# Patient Record
Sex: Male | Born: 1975
Health system: Southern US, Community
[De-identification: ages and names within clinical notes are randomized; demographics above are authoritative.]

## PROBLEM LIST (undated history)

## (undated) ENCOUNTER — Emergency Department (HOSPITAL_COMMUNITY): Discharge status: Against Medical Advice | Payer: Commercial Managed Care - PPO

## (undated) DIAGNOSIS — E2681 Bartter's syndrome: Secondary | ICD-10-CM

## (undated) DIAGNOSIS — K589 Irritable bowel syndrome without diarrhea: Secondary | ICD-10-CM

## (undated) DIAGNOSIS — K8020 Calculus of gallbladder without cholecystitis without obstruction: Secondary | ICD-10-CM

## (undated) DIAGNOSIS — F419 Anxiety disorder, unspecified: Secondary | ICD-10-CM

## (undated) DIAGNOSIS — E876 Hypokalemia: Secondary | ICD-10-CM

## (undated) DIAGNOSIS — E785 Hyperlipidemia, unspecified: Secondary | ICD-10-CM

## (undated) DIAGNOSIS — I10 Essential (primary) hypertension: Secondary | ICD-10-CM

## (undated) HISTORY — DX: Anxiety disorder, unspecified: F41.9

## (undated) HISTORY — DX: Irritable bowel syndrome without diarrhea: K58.9

## (undated) HISTORY — DX: Hypokalemia: E87.6

## (undated) HISTORY — DX: Bartter's syndrome: E26.81

## (undated) HISTORY — PX: NO PAST SURGERIES: SHX2092

## (undated) HISTORY — DX: Hyperlipidemia, unspecified: E78.5

## (undated) HISTORY — DX: Essential (primary) hypertension: I10

## (undated) HISTORY — DX: Calculus of gallbladder without cholecystitis without obstruction: K80.20

---

## 1999-06-28 ENCOUNTER — Emergency Department (HOSPITAL_COMMUNITY): Admission: EM | Admit: 1999-06-28 | Discharge: 1999-06-28 | Payer: Self-pay | Admitting: Emergency Medicine

## 2005-05-11 ENCOUNTER — Ambulatory Visit: Payer: Self-pay | Admitting: Pulmonary Disease

## 2005-06-15 ENCOUNTER — Ambulatory Visit: Payer: Self-pay | Admitting: Pulmonary Disease

## 2005-07-01 ENCOUNTER — Ambulatory Visit: Payer: Self-pay | Admitting: Pulmonary Disease

## 2005-07-20 ENCOUNTER — Ambulatory Visit: Payer: Self-pay | Admitting: Pulmonary Disease

## 2005-08-11 ENCOUNTER — Ambulatory Visit: Payer: Self-pay | Admitting: Pulmonary Disease

## 2007-01-12 ENCOUNTER — Emergency Department (HOSPITAL_COMMUNITY): Admission: EM | Admit: 2007-01-12 | Discharge: 2007-01-13 | Payer: Self-pay | Admitting: Emergency Medicine

## 2007-03-03 ENCOUNTER — Ambulatory Visit: Payer: Self-pay | Admitting: Pulmonary Disease

## 2007-03-03 LAB — CONVERTED CEMR LAB
Basophils Relative: 0.5 % (ref 0.0–1.0)
Bilirubin, Direct: 0.1 mg/dL (ref 0.0–0.3)
CO2: 32 meq/L (ref 19–32)
Cholesterol: 220 mg/dL (ref 0–200)
Direct LDL: 132.4 mg/dL
Eosinophils Relative: 1 % (ref 0.0–5.0)
GFR calc Af Amer: 113 mL/min
Glucose, Bld: 114 mg/dL — ABNORMAL HIGH (ref 70–99)
Hemoglobin: 14.1 g/dL (ref 13.0–17.0)
Lymphocytes Relative: 28.5 % (ref 12.0–46.0)
Monocytes Absolute: 0.8 10*3/uL — ABNORMAL HIGH (ref 0.2–0.7)
Monocytes Relative: 10.3 % (ref 3.0–11.0)
Neutro Abs: 4.7 10*3/uL (ref 1.4–7.7)
Potassium: 2.9 meq/L — ABNORMAL LOW (ref 3.5–5.1)
TSH: 1.99 microintl units/mL (ref 0.35–5.50)
Total Protein: 8 g/dL (ref 6.0–8.3)
VLDL: 20 mg/dL (ref 0–40)
WBC: 7.9 10*3/uL (ref 4.5–10.5)

## 2008-04-23 ENCOUNTER — Ambulatory Visit: Payer: Self-pay | Admitting: Pulmonary Disease

## 2008-04-23 DIAGNOSIS — E876 Hypokalemia: Secondary | ICD-10-CM | POA: Insufficient documentation

## 2008-04-23 DIAGNOSIS — K589 Irritable bowel syndrome without diarrhea: Secondary | ICD-10-CM | POA: Insufficient documentation

## 2008-04-23 DIAGNOSIS — E7800 Pure hypercholesterolemia, unspecified: Secondary | ICD-10-CM | POA: Insufficient documentation

## 2008-04-24 LAB — CONVERTED CEMR LAB
ALT: 38 units/L (ref 0–53)
AST: 26 units/L (ref 0–37)
Albumin: 4.4 g/dL (ref 3.5–5.2)
BUN: 16 mg/dL (ref 6–23)
Basophils Relative: 1.1 % (ref 0.0–3.0)
Chloride: 101 meq/L (ref 96–112)
Creatinine, Ser: 1.1 mg/dL (ref 0.4–1.5)
Direct LDL: 153 mg/dL
Eosinophils Absolute: 0.1 10*3/uL (ref 0.0–0.7)
Eosinophils Relative: 1.6 % (ref 0.0–5.0)
GFR calc non Af Amer: 83 mL/min
Glucose, Bld: 114 mg/dL — ABNORMAL HIGH (ref 70–99)
HCT: 43.9 % (ref 39.0–52.0)
HDL: 43.4 mg/dL (ref >39.0)
MCV: 79.8 fL (ref 78.0–100.0)
Monocytes Relative: 10.9 % (ref 3.0–12.0)
Neutrophils Relative %: 57.8 % (ref 43.0–77.0)
RBC: 5.49 M/uL (ref 4.22–5.81)
Total Protein: 7.4 g/dL (ref 6.0–8.3)
WBC: 7.9 10*3/uL (ref 4.5–10.5)

## 2008-05-16 ENCOUNTER — Encounter: Payer: Self-pay | Admitting: Pulmonary Disease

## 2008-05-24 ENCOUNTER — Encounter: Payer: Self-pay | Admitting: Pulmonary Disease

## 2008-06-07 ENCOUNTER — Encounter: Payer: Self-pay | Admitting: Pulmonary Disease

## 2008-06-26 ENCOUNTER — Encounter: Payer: Self-pay | Admitting: Pulmonary Disease

## 2008-08-17 ENCOUNTER — Encounter: Payer: Self-pay | Admitting: Pulmonary Disease

## 2008-09-13 ENCOUNTER — Encounter: Payer: Self-pay | Admitting: Pulmonary Disease

## 2008-09-25 ENCOUNTER — Encounter: Payer: Self-pay | Admitting: Pulmonary Disease

## 2009-01-07 ENCOUNTER — Ambulatory Visit: Payer: Self-pay | Admitting: Pulmonary Disease

## 2009-01-08 LAB — CONVERTED CEMR LAB
ALT: 32 units/L (ref 0–53)
Albumin: 5 g/dL (ref 3.5–5.2)
Alkaline Phosphatase: 62 units/L (ref 39–117)
Basophils Relative: 1.3 % (ref 0.0–3.0)
CO2: 34 meq/L — ABNORMAL HIGH (ref 19–32)
Chloride: 99 meq/L (ref 96–112)
Cholesterol: 240 mg/dL — ABNORMAL HIGH (ref 0–200)
Direct LDL: 162.1 mg/dL
Eosinophils Absolute: 0 10*3/uL (ref 0.0–0.7)
Eosinophils Relative: 0.5 % (ref 0.0–5.0)
Hemoglobin: 15.8 g/dL (ref 13.0–17.0)
MCHC: 33.9 g/dL (ref 30.0–36.0)
MCV: 79.6 fL (ref 78.0–100.0)
Magnesium: 1.8 mg/dL (ref 1.5–2.5)
Monocytes Absolute: 0.9 10*3/uL (ref 0.1–1.0)
Neutro Abs: 7 10*3/uL (ref 1.4–7.7)
Potassium: 3.9 meq/L (ref 3.5–5.1)
RBC: 5.84 M/uL — ABNORMAL HIGH (ref 4.22–5.81)
Sodium: 144 meq/L (ref 135–145)
Total CHOL/HDL Ratio: 3
Total Protein: 8.4 g/dL — ABNORMAL HIGH (ref 6.0–8.3)
VLDL: 14.4 mg/dL (ref 0.0–40.0)
WBC: 9.9 10*3/uL (ref 4.5–10.5)

## 2009-01-09 ENCOUNTER — Encounter: Payer: Self-pay | Admitting: Pulmonary Disease

## 2009-03-28 ENCOUNTER — Encounter: Payer: Self-pay | Admitting: Pulmonary Disease

## 2009-05-07 ENCOUNTER — Telehealth (INDEPENDENT_AMBULATORY_CARE_PROVIDER_SITE_OTHER): Payer: Self-pay | Admitting: *Deleted

## 2009-05-09 ENCOUNTER — Encounter: Payer: Self-pay | Admitting: Adult Health

## 2009-05-09 ENCOUNTER — Ambulatory Visit: Payer: Self-pay | Admitting: Pulmonary Disease

## 2009-05-09 LAB — CONVERTED CEMR LAB
BUN: 16 mg/dL
CO2: 34 meq/L — ABNORMAL HIGH
Calcium: 9.5 mg/dL
Chloride: 98 meq/L
Creatinine, Ser: 1.3 mg/dL
GFR calc non Af Amer: 81.86 mL/min
Glucose, Bld: 121 mg/dL — ABNORMAL HIGH
Potassium: 3.9 meq/L
Sodium: 140 meq/L

## 2009-05-10 LAB — CONVERTED CEMR LAB
Direct LDL: 143.1 mg/dL
Total CHOL/HDL Ratio: 4
Triglycerides: 109 mg/dL (ref 0.0–149.0)

## 2009-08-12 ENCOUNTER — Encounter: Payer: Self-pay | Admitting: Pulmonary Disease

## 2009-09-25 ENCOUNTER — Encounter: Payer: Self-pay | Admitting: Pulmonary Disease

## 2010-03-21 ENCOUNTER — Encounter: Payer: Self-pay | Admitting: Pulmonary Disease

## 2010-09-24 ENCOUNTER — Encounter: Payer: Self-pay | Admitting: Pulmonary Disease

## 2010-10-02 NOTE — Letter (Signed)
Summary: [facility]   Imported By: Lester New [place]ox 11/08/2009 07:45:39  _____________________________________________________________________  External Attachment:    Type:   Image     Comment:   External Document

## 2010-10-02 NOTE — Letter (Signed)
Summary: [facility]  [facility]   Imported By: Lennie Odor 04/01/2010 13:47:16  _____________________________________________________________________  External Attachment:    Type:   Image     Comment:   External Document

## 2010-10-22 NOTE — Letter (Signed)
Summary: [facility]   Imported By: Sherian Rein 10/15/2010 10:59:17  _____________________________________________________________________  External Attachment:    Type:   Image     Comment:   External Document

## 2010-10-22 NOTE — Consult Note (Signed)
Summary: [facility]   Imported By: Sherian Rein 10/13/2010 14:18:54  _____________________________________________________________________  External Attachment:    Type:   Image     Comment:   External Document

## 2010-10-28 ENCOUNTER — Encounter: Payer: Self-pay | Admitting: Pulmonary Disease

## 2010-10-28 ENCOUNTER — Ambulatory Visit (INDEPENDENT_AMBULATORY_CARE_PROVIDER_SITE_OTHER): Payer: Commercial Managed Care - PPO | Admitting: Pulmonary Disease

## 2010-10-28 DIAGNOSIS — F410 Panic disorder [episodic paroxysmal anxiety] without agoraphobia: Secondary | ICD-10-CM

## 2010-10-28 DIAGNOSIS — E7800 Pure hypercholesterolemia, unspecified: Secondary | ICD-10-CM

## 2010-10-28 DIAGNOSIS — E876 Hypokalemia: Secondary | ICD-10-CM

## 2010-10-28 DIAGNOSIS — K589 Irritable bowel syndrome without diarrhea: Secondary | ICD-10-CM

## 2010-11-08 DIAGNOSIS — I10 Essential (primary) hypertension: Secondary | ICD-10-CM | POA: Insufficient documentation

## 2010-11-08 DIAGNOSIS — F410 Panic disorder [episodic paroxysmal anxiety] without agoraphobia: Secondary | ICD-10-CM | POA: Insufficient documentation

## 2010-11-08 DIAGNOSIS — R0789 Other chest pain: Secondary | ICD-10-CM | POA: Insufficient documentation

## 2010-11-11 NOTE — Assessment & Plan Note (Signed)
Summary: follow up/ms   CC:  21 month ROV & review of mult medical problems....  History of Present Illness: 35 y/o BM here for a follow up visit... 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 he has been treated w/ Amiloroide 5mg /d + KCl + MagOx... he has done well since then and he is followed regularly by DrDunham w/ freq lab draws (prev non-compliant w/ the electrolytes & now doing better)...   ~  October 28, 2010:  Referred back by DrDunham due to the onset of mild hypertension & f/u dyslipidemia>  last labs from Nephrology indicates K=3.3 on his KDur20 supplements & he was increased to 2Qid; Mg=1.3 on his MagOx supplements which were also incr to 2Qid...    BP today= 142/82 and DrDunham wants his BP no higher than 130/80;  they have decided to monitor his BP carefully at home & at work, then decide if he needs medication (other than the Amiloride) as he was very resistant to the idea of starting medication for his BP...    Similarly he has been against the idea of starting meds for his hypercholesterolemia> labs in our office in 2009-10 showed TChol ~220-240 & LDL ~130-160;  when last seen in 2010 he was given Crestor 10mg  but he didn't take it;  DrDunham did FLP 7/11 w/ TChol 212, TG 97, HDL 64, LDL 129;  he now has an OTC supplement "medication" (MegaMan Heart- Vitamin Supplement w/ phytosterols) that he very much wants to try before considering a statin- we bargained to try this for 77mo & ret here end April for FASTING lipid profile (he promises to consider the statins if his numbers are no better on this supplement).   Current Problem List:  HYPOKALEMIA (ICD-276.8) & HYPOMAGNESEMIA (ICD-275.2) - he has idiopathic Hypok and Hypomag w/ K+ as low  as 2.6 and Mg++ as low as 1.1.Marland KitchenMarland Kitchen we discussed this w/ DrFox of Nephrology and a urine screen for surruptitious diuretic use was negative, as was the urine test for phenolphthalein... he is not hypertensive... he denies any eating disorder- no n/v, no diarrhea, etc... on careful questioning - he does take "apple cider vinegar" 1 tsp daily to "clean out my system"...we have repeatedly perscribed KCl and MagOxide for him and he will take it for awhile and then he will stop it on his own...   ~  old labs show first K+ done here 1997 was 3.6, but spec noted to be sl hemolyzed...  ~  he was hosp in 1999 w/ gastroenteritis- n/v/d with postural hypotension and K+ 2.5 on adm... he was taking Creatine to build muscles and told to stop this... all K+ values since then has been low...  ~  spot urine K+ = 22mEq/L 12/06 when K was 3.3 & Mg was 1.3.Marland KitchenMarland Kitchen  ~  labs 7/08 showed K+= 2.9, Mg++= 1.1.Marland Kitchen. he was perscribed KCl & MagOx but stopped after 1 month and never returned...  ~  Nephrology eval 9/09 by DrDunham led to the Dx of Gitelman's syndrome or Bartter's syndrome & Rx w/ AMILORIDE 5mg /d, KCL - taking 2Tid, & MagOx 400mg - taking 2Tid...  ~  labs followed regularly by DrDunham since then...  HYPERCHOLESTEROLEMIA (ICD-272.0) - he has elev TChol & LDL but has refused to take statin meds, preferring instead diet management...  ~  FLP 7/08 showed TChol 220, Tg 98, HDL 67, LDL 132... he prefers diet therapy, discussed...  ~  FLP 8/09 showed TChol 226, TG 139, HDL 43, LDL 153... needs to do better if he is to avoid medication.  ~  FLP 5/10 showed TChol 240, TG 72, HDL 72, LDL 162... rec> start Crestor10mg /d (he never did).  IRRITABLE BOWEL SYNDROME (ICD-564.1) - no symptoms since 2003... he was prev on Metamucil & Levsin as needed.  ? of CONDYLOMA (ICD-078.10) - new problem 5/10- pt noted abn on scrotum and exam reveals prob condyloma... he was referred to Urology & saw [facility] 5/10> his note indicates that he did not  think these were condyloma, he referred him to [facility] but we never received any follow up notes or Derm eval.  PANIC ATTACK (ICD-300.01) - not currently on meds... hx panic attacks and agoraphobia treated by [facility] (Dr. Elna Breslow) in 2000 w/ Zoloft & Xanax...   Preventive Screening-Counseling & Management  Alcohol-Tobacco     Smoking Status: never  Allergies (verified): No Known Drug Allergies  Comments:  Nurse/Medical Assistant: The patient's medications and allergies were reviewed with the patient and were updated in the Medication and Allergy Lists.  Past History:  Past Medical History: HYPERTENSION, BORDERLINE (ICD-401.9) Hx of CHEST PAIN, ATYPICAL (ICD-786.59) HYPOKALEMIA (ICD-276.8) HYPOMAGNESEMIA (ICD-275.2) HYPERCHOLESTEROLEMIA (ICD-272.0) IRRITABLE BOWEL SYNDROME (ICD-564.1) ? of CONDYLOMA (ICD-078.10) Hx of PANIC ATTACK (ICD-300.01)  Family History: Reviewed history from 05/09/2009 and no changes required. neg for cardiac dz.   Social History: Reviewed history from 05/09/2009 and no changes required. 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    Vital Signs:  Patient profile:   35 year old male Height:      70 inches Weight:      167.50 pounds BMI:     24.12 O2 Sat:      100 % on Room air Temp:     98.8 degrees F oral Pulse rate:   124 / minute BP sitting:   148 / 82  (right  arm) Cuff size:   regular  Vitals Entered By: Randell Loop CMA (October 28, 2010 3:06 PM)  O2 Sat at Rest %:  100 O2 Flow:  Room air CC: 21 month ROV & review of mult medical problems... Is Patient Diabetic? No Pain Assessment Patient in pain? no      Comments meds updated today with pt   Physical Exam  Additional Exam:  WD, WN, 34 y/o BM in NAD... GENERAL:  Alert & oriented; pleasant & cooperative... HEENT:  [place]/AT, EACs-clear, TMs-wnl, NOSE-clear, THROAT-clear & wnl. NECK:  Supple w/ full ROM; no JVD; normal carotid impulses w/o bruits; no thyromegaly or nodules palpated; no lymphadenopathy. CHEST:  Clear to P & A; without wheezes/ rales/ or rhonchi., tender along mid upper left chest wall, no ecchymosis, or rash HEART:  Regular Rhythm; without murmurs/ rubs/ or gallops. ABDOMEN:  Soft & nontender; normal bowel sounds; no organomegaly or masses detected.  EXT: without deformities or arthritic changes; no varicose veins/ venous  insuffic/ or edema. NEURO:  CN's intact; motor testing normal; sensory testing normal; gait normal & balance OK. DERM:  No lesions noted; no rash etc...    Impression & Recommendations:  Problem # 1:  HYPERTENSION, BORDERLINE (ICD-401.9) Borderline, mildly hypertensive readings recently>  he has addressed this w/ DrDunham & they decided to monitor carefully at home & work & decide in follow up if he needs med rx... His updated medication list for this problem includes:    Amiloride Hcl 5 Mg Tabs (Amiloride hcl) .Marland Kitchen... Take 1 tab by mouth once daily...  Problem # 2:  HYPERCHOLESTEROLEMIA (ICD-272.0) We reviewed all his readings over the last few yrs>  in my opinion he needs starting dose of a statin medication but he has refused in the past & I didn't want to push to hard... HE wants to try a supplement medication called MegaMan Heart that contains phytosterols which he thinks will improve his numbers... we bargained that if there is no improvement over  say 81mo of therapy w/ the MegaMan then he will try MY med to see if it works better...  Problem # 3:  HYPOKALEMIA (ICD-276.8) He has Gitelman's or Bartter's syndrome followed by DrDunham on Amiloride & KCl + MagOx...  Problem # 4:  ? of CONDYLOMA (ICD-078.10) We never did find out what these were> DrNesi didn't think they were condyloma & sent him to derm but we don't have notes...  Complete Medication List: 1)  Amiloride Hcl 5 Mg Tabs (Amiloride hcl) .... Take 1 tab by mouth once daily.Marland KitchenMarland Kitchen 2)  Potassium Chloride Crys Cr 20 Meq Cr-tabs (Potassium chloride crys cr) .... Take 2 tabs by mouth four  times a day... 3)  Mag-ox 400 400 Mg Tabs (Magnesium oxide) .... Take 2 tabs by mouth four  times a day... 4)  Fish Oil 1000 Mg Caps (Omega-3 fatty acids) .... Take one capsule by mouth once daily 5)  Megaman Heart  .... Take 1 tab by mouth three times a day...  Patient Instructions: 1)  Today we updated your med list- see below.... 2)  We decided to give the [facility] a trial for 2 months (march & April).Marland KitchenMarland Kitchen 3)  Please give [place] a call towards the end of April when you are ready to recheck your FASTING lipid panel (we will set up for your to come by our lab at that time & we will contact you w/ the results). 4)  We can then decide whether you will require medication for the cholesterol... 5)  Call for any problems or questions...   Immunization History:  Influenza Immunization History:    Influenza:  historical (06/17/2010)

## 2011-01-21 ENCOUNTER — Encounter: Payer: Self-pay | Admitting: Pulmonary Disease

## 2012-10-04 ENCOUNTER — Other Ambulatory Visit (INDEPENDENT_AMBULATORY_CARE_PROVIDER_SITE_OTHER): Payer: Commercial Managed Care - PPO

## 2012-10-04 ENCOUNTER — Ambulatory Visit (INDEPENDENT_AMBULATORY_CARE_PROVIDER_SITE_OTHER): Payer: Commercial Managed Care - PPO | Admitting: Pulmonary Disease

## 2012-10-04 ENCOUNTER — Encounter: Payer: Self-pay | Admitting: Pulmonary Disease

## 2012-10-04 VITALS — BP 124/64 | HR 79 | Temp 97.3°F | Ht 70.0 in | Wt 168.6 lb

## 2012-10-04 DIAGNOSIS — I10 Essential (primary) hypertension: Secondary | ICD-10-CM

## 2012-10-04 DIAGNOSIS — F410 Panic disorder [episodic paroxysmal anxiety] without agoraphobia: Secondary | ICD-10-CM

## 2012-10-04 DIAGNOSIS — E7800 Pure hypercholesterolemia, unspecified: Secondary | ICD-10-CM

## 2012-10-04 DIAGNOSIS — K589 Irritable bowel syndrome without diarrhea: Secondary | ICD-10-CM

## 2012-10-04 DIAGNOSIS — E876 Hypokalemia: Secondary | ICD-10-CM

## 2012-10-04 LAB — CBC WITH DIFFERENTIAL/PLATELET
Basophils Relative: 0.2 % (ref 0.0–3.0)
Eosinophils Relative: 0.8 % (ref 0.0–5.0)
HCT: 41.5 % (ref 39.0–52.0)
Hemoglobin: 13.6 g/dL (ref 13.0–17.0)
Lymphocytes Relative: 29 % (ref 12.0–46.0)
Lymphs Abs: 2.3 10*3/uL (ref 0.7–4.0)
Monocytes Relative: 9.2 % (ref 3.0–12.0)
Neutro Abs: 4.8 10*3/uL (ref 1.4–7.7)
RBC: 5.32 Mil/uL (ref 4.22–5.81)

## 2012-10-04 LAB — HEPATIC FUNCTION PANEL
Albumin: 4.4 g/dL (ref 3.5–5.2)
Alkaline Phosphatase: 66 U/L (ref 39–117)
Total Protein: 7.5 g/dL (ref 6.0–8.3)

## 2012-10-04 LAB — LIPID PANEL
Cholesterol: 201 mg/dL — ABNORMAL HIGH (ref 0–200)
HDL: 66.6 mg/dL (ref >39.00)
Total CHOL/HDL Ratio: 3
Triglycerides: 143 mg/dL (ref 0.0–149.0)

## 2012-10-04 LAB — LDL CHOLESTEROL, DIRECT: Direct LDL: 102.5 mg/dL

## 2012-10-04 LAB — BASIC METABOLIC PANEL
CO2: 31 mEq/L (ref 19–32)
Calcium: 9.4 mg/dL (ref 8.4–10.5)
Creatinine, Ser: 1.3 mg/dL (ref 0.4–1.5)
GFR: 83.95 mL/min (ref >60.00)
Glucose, Bld: 107 mg/dL — ABNORMAL HIGH (ref 70–99)
Sodium: 140 mEq/L (ref 135–145)

## 2012-10-04 NOTE — Patient Instructions (Addendum)
Today we updated your med list in our EPIC system...    Continue your current medications the same...  Today we did your follow up FASTING blood work...    We wll contact you w/ the results when avail...  Call for any problems or if we can be of service in any way.Marland KitchenMarland Kitchen

## 2012-10-04 NOTE — Progress Notes (Signed)
Subjective:     Patient ID: David Martin, male   DOB: Aug 25, 1976, 37 y.o.   MRN: [id number]  HPI 37 y/o BM here for a follow up visit... 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 he has been treated w/ Amiloroide 5mg /d + KCl + MagOx... he has done well since then and he is followed regularly by DrDunham w/ freq lab draws (prev non-compliant w/ the electrolytes & now doing better)...  ~  October 28, 2010:  Referred back by DrDunham due to the onset of mild hypertension & f/u dyslipidemia>  last labs from Nephrology indicates K=3.3 on his KDur20 supplements & he was increased to 2Qid; Mg=1.3 on his MagOx supplements which were also incr to 2Qid...    BP today= 142/82 and DrDunham wants his BP no higher than 130/80;  they have decided to monitor his BP carefully at home & at work, then decide if he needs medication (other than the Amiloride) as he was very resistant to the idea of starting medication for his BP...    Similarly he has been against the idea of starting meds for his hypercholesterolemia> labs in our office in 2009-10 showed TChol~220-240 & LDL~130-160;  when last seen in 2010 he was given Crestor 10mg  but he didn't take it;  DrDunham did FLP 7/11 w/ TChol 212, TG 97, HDL 64, LDL 129;  he now has an OTC supplement "medication" (MegaMan Heart- Vitamin Supplement w/ phytosterols) that he very much wants to try before considering a statin- we bargained to try this for 74mo & ret here end April for FASTING lipid profile (he promises to consider the statins if his numbers are no better on this supplement).  ~  October 04, 2012:  37yr ROV & Jarrin returns for an ROV> he is followed regularly Q87mo by DrDunham for Nephrology & she has done his general  labs- he has HBP w/ Gitelman's vs Bartter's syndrome & has been stable on Rx w/ Amilioride 5mg /d, K20-2Qid, Mag400-2Qid, along w/ Fish Oil, MVI, etc...  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     We reviewed prob list, meds, xrays and labs> see below for updates >>  LABS 2/14:  FLP- looks good, close to goals on diet alone;  Chems- K=3.0 Mg=1.4 Creat=1.3;  CBC- wnl;  TSH=3.71;           Problem List:   HYPOKALEMIA (ICD-276.8) & HYPOMAGNESEMIA (ICD-275.2) - he has idiopathic Hypok and Hypomag w/ K+ as low as 2.6 and Mg++ as low as 1.1.Marland KitchenMarland Kitchen we discussed this w/ DrFox of Nephrology and a urine screen for surruptitious diuretic use was negative, as was the urine test for phenolphthalein... he is not hypertensive... he denies any eating disorder- no n/v, no diarrhea, etc... on careful questioning - he does take "apple cider vinegar" 1 tsp daily to "clean out my system"...we have repeatedly perscribed KCl and MagOxide for him and he will take it for awhile and then he will stop it on his own...  ~  old labs show first K+ done here 1997 was 3.6, but spec noted to be sl hemolyzed... ~  he was hosp in 1999 w/ gastroenteritis- n/v/d with postural hypotension and  K+ 2.5 on adm... he was taking Creatine to build muscles and told to stop this... all K+ values since then has been low... ~  spot urine K+ = 26mEq/L 12/06 when K was 3.3 & Mg was 1.3.Marland KitchenMarland Kitchen ~  labs 7/08 showed K+= 2.9, Mg++= 1.1.Marland Kitchen. he was perscribed KCl & MagOx but stopped after 1 month and never returned... ~  Nephrology eval 9/09 by DrDunham led to the Dx of Gitelman's syndrome or Bartter's syndrome & Rx w/ AMILORIDE 5mg /d, KCL - taking 2Tid, & MagOx 400mg - taking 2Tid... ~  labs followed regularly by DrDunham since then & K20 and Mag400 both increased to 2Qid...  ~  Labs 9/10 here showed K=3.9, Mg=1.8, BUN=16, Creat=1.3 ~  Interim labs  every 16mo by Nephrology, DrDunham... ~  Labs 2/14 here showed K=3.0, Mg=1.4, BUN=19, Creat=1.3 => may need to incr to 10/d of his K20 & Mag400...  HYPERCHOLESTEROLEMIA (ICD-272.0) - he has elev TChol & LDL but has refused to take statin meds, preferring instead diet management... ~  FLP 7/08 showed TChol 220, Tg 98, HDL 67, LDL 132... he prefers diet therapy, discussed... ~  FLP 8/09 showed TChol 226, TG 139, HDL 43, LDL 153... needs to do better if he is to avoid medication. ~  FLP 5/10 showed TChol 240, TG 72, HDL 72, LDL 162... rec> start Crestor10mg /d (he never did). ~  FLP 2/14 on diet + exercise showed TChol 201, TG 143, HDL 67, LDL 103... Rec> continue same...  IRRITABLE BOWEL SYNDROME (ICD-564.1) - no symptoms since 2003... he was prev on Metamucil & Levsin as needed.  ? of CONDYLOMA (ICD-078.10) - new problem 5/10- pt noted abn on scrotum and exam reveals prob condyloma... he was referred to Urology & saw [facility] 5/10> his note indicates that he did not think these were condyloma, he referred him to [facility] but we never received any follow up notes or Derm eval.  PANIC ATTACK (ICD-300.01) - not currently on meds... hx panic attacks and agoraphobia treated by [facility] (Dr. Elna Breslow) in 2000 w/ Zoloft & Xanax...   No past surgical history on file.   Outpatient Encounter Prescriptions as of 10/04/2012  Medication Sig Dispense Refill  . aMILoride (MIDAMOR) 5 MG tablet Take 5 mg by mouth daily.      . fish oil-omega-3 fatty acids 1000 MG capsule Take 2 g by mouth daily.      . magnesium oxide (MAG-OX) 400 MG tablet Take 2 tablet by mouth four times daily      . Multiple Vitamins-Minerals (MULTIVITAMIN PO) Take 1 tablet by mouth daily.      . potassium chloride SA (KLOR-CON M20) 20 MEQ tablet Take 2 tablet by mouth four times daily        No Known Allergies   Current Medications, Allergies, Past Medical History, Past Surgical History, Family History, and Social History  were reviewed in [employer] record. 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  The patient denies anorexia, fever, weight loss, weight gain, vision loss, decreased hearing, hoarseness, chest pain, syncope, dyspnea on exertion, peripheral edema, prolonged cough, headaches, hemoptysis, abdominal pain, melena, hematochezia, severe indigestion/heartburn, hematuria, incontinence, muscle weakness, suspicious skin lesions, transient blindness, difficulty walking, depression, unusual weight change, abnormal bleeding, enlarged lymph nodes, and angioedema.  Objective:   Physical Exam     WD, WN, 36 y/o BM in NAD... GENERAL:  Alert & oriented; pleasant & cooperative... HEENT:  [place]/AT, EACs-clear, TMs-wnl, NOSE-clear, THROAT-clear & wnl. NECK:  Supple w/ full ROM; no JVD; normal carotid impulses w/o bruits; no thyromegaly or nodules palpated; no lymphadenopathy. CHEST:  Clear to P & A; without wheezes/ rales/ or rhonchi., tender along mid upper left chest wall, no ecchymosis, or rash HEART:  Regular Rhythm; without murmurs/ rubs/ or gallops. ABDOMEN:  Soft & nontender; normal bowel sounds; no organomegaly or masses detected.  EXT: without deformities or arthritic changes; no varicose veins/ venous insuffic/ or edema. NEURO:  CN's intact; motor testing normal; sensory testing normal; gait normal & balance OK. DERM:  No lesions noted; no rash etc...  RADIOLOGY DATA:  Reviewed in the EPIC EMR & discussed w/ the patient...  LABORATORY DATA:  Reviewed in the EPIC EMR & discussed w/ the patient...   Assessment:      HYPOKALEMIA & HYPOMAG, Prob Gitelman's vs Bartter's Syndrome>> managed expertly by DrDunham for Nephrology on Amilioride5mg /d & K20-2Qid + Mag400-2Qid and todays labs are still low;  He may need to  increase to 10/d of the K20's & Mag400's...  ? Of White Coat Hypertension> BP initially 158/60 and improved to 124/64 by end of the visit today; continue same meds...  HYPERCHOLESTEROLEMIA> much improved on diet & exercise & FishOil; continue same...  Hx IBS> GI is doing well, asymptomatic...  Hx Panic Attacks> not currently on meds; treated yrs ago by [facility] for Psyche w/ Zoloft & Xanax...     Plan:     Patient's Medications  New Prescriptions   No medications on file  Previous Medications   AMILORIDE (MIDAMOR) 5 MG TABLET    Take 5 mg by mouth daily.   FISH OIL-OMEGA-3 FATTY ACIDS 1000 MG CAPSULE    Take 2 g by mouth daily.   MAGNESIUM OXIDE (MAG-OX) 400 MG TABLET    Take 2 tablet by mouth four times daily   MULTIPLE VITAMINS-MINERALS (MULTIVITAMIN PO)    Take 1 tablet by mouth daily.   POTASSIUM CHLORIDE SA (KLOR-CON M20) 20 MEQ TABLET    Take 2 tablet by mouth four times daily  Modified Medications   No medications on file  Discontinued Medications   No medications on file

## 2013-07-06 ENCOUNTER — Other Ambulatory Visit: Payer: Self-pay

## 2014-03-29 ENCOUNTER — Other Ambulatory Visit: Payer: Self-pay | Admitting: Nephrology

## 2014-03-29 ENCOUNTER — Ambulatory Visit
Admission: RE | Admit: 2014-03-29 | Discharge: 2014-03-29 | Disposition: A | Discharge status: Home / Self Care | Payer: 59 | Source: Ambulatory Visit | Attending: Nephrology | Admitting: Nephrology

## 2014-03-29 DIAGNOSIS — R52 Pain, unspecified: Secondary | ICD-10-CM

## 2014-04-10 ENCOUNTER — Encounter: Payer: Self-pay | Admitting: Gastroenterology

## 2014-06-12 ENCOUNTER — Ambulatory Visit (INDEPENDENT_AMBULATORY_CARE_PROVIDER_SITE_OTHER): Payer: 59 | Admitting: Gastroenterology

## 2014-06-12 ENCOUNTER — Encounter: Payer: Self-pay | Admitting: Gastroenterology

## 2014-06-12 VITALS — BP 130/68 | HR 84 | Ht 70.0 in | Wt 172.6 lb

## 2014-06-12 DIAGNOSIS — K8020 Calculus of gallbladder without cholecystitis without obstruction: Secondary | ICD-10-CM

## 2014-06-12 NOTE — Patient Instructions (Signed)
You probably do have gallstones but you have no symptoms at all that are related to gallstones and so no treatment is needed. Please call if you have problems (abdominal pains).

## 2014-06-12 NOTE — Progress Notes (Signed)
HPI: This is a  very pleasant 38 year old man whom I am meeting for the first time today.  Was having bilateral lower back pains.   He will underwent abdominal plain films and this suggested calcified gallstones.  He's had no epigastric or RUQ pains at all.  No post prandial pains, no nausea. His weight has been stable.  Review of systems: Pertinent positive and negative review of systems were noted in the above HPI section. Complete review of systems was performed and was otherwise normal.    Past Medical History  Diagnosis Date  . Borderline hypertension   . Chest pain, atypical   . Hypokalemia   . Hypomagnesemia   . Hypercholesterolemia   . IBS (irritable bowel syndrome)   . Panic attack   . Condyloma     History reviewed. No pertinent past surgical history.  Current Outpatient Prescriptions  Medication Sig Dispense Refill  . aMILoride (MIDAMOR) 5 MG tablet Take 5 mg by mouth daily.      . magnesium oxide (MAG-OX) 400 MG tablet Take 2 tablet by mouth four times daily      . Multiple Vitamins-Minerals (MULTIVITAMIN PO) Take 1 tablet by mouth daily.      . potassium chloride SA (KLOR-CON M20) 20 MEQ tablet Take 2 tablet by mouth four times daily       No current facility-administered medications for this visit.    Allergies as of 06/12/2014  . (No Known Allergies)    No family history on file.  History   Social History  . Marital Status: Married    Spouse Name: natalia    Number of Children: 1  . Years of Education: N/A   Occupational History  . respiratory therapist    Social History Main Topics  . Smoking status: Never Smoker   . Smokeless tobacco: Never Used  . Alcohol Use: No  . Drug Use: No  . Sexual Activity: Not on file   Other Topics Concern  . Not on file   Social History Narrative  . No narrative on file       Physical Exam: BP 130/68  Pulse 84  Ht 5\' 10"  (1.778 m)  Wt 172 lb 9.6 oz (78.291 kg)  BMI 24.77 kg/m2 Constitutional:  generally well-appearing Psychiatric: alert and oriented x3 Eyes: extraocular movements intact Mouth: oral pharynx moist, no lesions Neck: supple no lymphadenopathy Cardiovascular: heart regular rate and rhythm Lungs: clear to auscultation bilaterally Abdomen: soft, nontender, nondistended, no obvious ascites, no peritoneal signs, normal bowel sounds Extremities: no lower extremity edema bilaterally Skin: no lesions on visible extremities    Assessment and plan: 38 y.o. male with  likely gallstones on imaging, asymptomatic  I do feel that he probably has gallstones after reviewing the x-ray report and the images themselves. He has 0 symptoms that can be attributed to these gallstones however. I see no reason for any further blood tests or imaging studies. I explained to him that most people with gallstones in her gallbladder do not ever had symptoms from them however if he does start to have symptoms, the most common is epigastric pain, next dose, quadrant pain, then he should call and I would likely arrange for formal ultrasound and Gen. surgery referral.

## 2015-09-09 MED FILL — POTASSIUM CL ER 20 MEQ TAB: 20 | 30 days supply | Qty: 270 | Fill #0

## 2015-09-30 MED FILL — aMILoride HCL 5 MG TABS: 5 | 30 days supply | Qty: 30 | Fill #1

## 2015-10-10 MED FILL — POTASSIUM CL ER 20 MEQ TAB: 20 | 30 days supply | Qty: 270 | Fill #1

## 2015-10-30 MED FILL — aMILoride HCL 5 MG TABS: 5 | 30 days supply | Qty: 30 | Fill #2

## 2015-11-07 DIAGNOSIS — L299 Pruritus, unspecified: Secondary | ICD-10-CM | POA: Diagnosis not present

## 2015-11-07 DIAGNOSIS — H6123 Impacted cerumen, bilateral: Secondary | ICD-10-CM | POA: Diagnosis not present

## 2015-11-18 MED FILL — POTASSIUM CL ER 20 MEQ TAB: 20 | 30 days supply | Qty: 270 | Fill #2

## 2015-12-02 MED FILL — aMILoride HCL 5 MG TABS: 5 | 30 days supply | Qty: 30 | Fill #3

## 2015-12-10 DIAGNOSIS — E785 Hyperlipidemia, unspecified: Secondary | ICD-10-CM | POA: Diagnosis not present

## 2015-12-10 DIAGNOSIS — E876 Hypokalemia: Secondary | ICD-10-CM | POA: Diagnosis not present

## 2015-12-25 MED FILL — KLOR-CON M20 TABLET: 20 | 30 days supply | Qty: 270 | Fill #3

## 2016-01-06 DIAGNOSIS — Z5320 Procedure and treatment not carried out because of patient's decision for unspecified reasons: Secondary | ICD-10-CM | POA: Insufficient documentation

## 2016-01-06 DIAGNOSIS — E7800 Pure hypercholesterolemia, unspecified: Secondary | ICD-10-CM | POA: Diagnosis not present

## 2016-01-06 DIAGNOSIS — Z0000 Encounter for general adult medical examination without abnormal findings: Secondary | ICD-10-CM | POA: Diagnosis not present

## 2016-01-06 DIAGNOSIS — Z5329 Procedure and treatment not carried out because of patient's decision for other reasons: Secondary | ICD-10-CM | POA: Insufficient documentation

## 2016-01-06 DIAGNOSIS — E2681 Bartter's syndrome: Secondary | ICD-10-CM | POA: Insufficient documentation

## 2016-01-06 MED FILL — aMILoride HCL 5 MG TABS: 5 | 30 days supply | Qty: 30 | Fill #4

## 2016-01-28 MED FILL — KLOR-CON M20 TABLET: 20 | 30 days supply | Qty: 270 | Fill #4

## 2016-02-10 MED FILL — aMILoride HCL 5 MG TABS: 5 | 30 days supply | Qty: 30 | Fill #5

## 2016-02-28 MED FILL — KLOR-CON M20 TABLET: 20 | 30 days supply | Qty: 270 | Fill #5

## 2016-03-17 MED FILL — aMILoride HCL 5 MG TABS: 5 | 30 days supply | Qty: 30 | Fill #0

## 2016-04-10 MED FILL — KLOR-CON M20 TABLET: 20 | 30 days supply | Qty: 270 | Fill #0

## 2016-04-16 MED FILL — aMILoride HCL 5 MG TABS: 5 | 30 days supply | Qty: 30 | Fill #1

## 2016-05-08 MED FILL — KLOR-CON M20 TABLET: 20 | 30 days supply | Qty: 270 | Fill #1

## 2016-05-19 MED FILL — aMILoride HCL 5 MG TABS: 5 | 30 days supply | Qty: 30 | Fill #2

## 2016-06-05 DIAGNOSIS — E876 Hypokalemia: Secondary | ICD-10-CM | POA: Diagnosis not present

## 2016-06-05 DIAGNOSIS — E785 Hyperlipidemia, unspecified: Secondary | ICD-10-CM | POA: Diagnosis not present

## 2016-06-16 MED FILL — KLOR-CON M20 TABLET: 20 | 30 days supply | Qty: 270 | Fill #2

## 2016-06-22 MED FILL — aMILoride HCL 5 MG TABS: 5 | 30 days supply | Qty: 30 | Fill #3

## 2016-06-24 DIAGNOSIS — E876 Hypokalemia: Secondary | ICD-10-CM | POA: Diagnosis not present

## 2016-07-17 MED FILL — KLOR-CON M20 TABLET: 20 | 30 days supply | Qty: 270 | Fill #3

## 2016-07-20 MED FILL — aMILoride HCL 5 MG TABS: 5 | 30 days supply | Qty: 30 | Fill #4

## 2016-08-04 DIAGNOSIS — R220 Localized swelling, mass and lump, head: Secondary | ICD-10-CM | POA: Diagnosis not present

## 2016-08-17 MED FILL — KLOR-CON M20 TABLET: 20 | 30 days supply | Qty: 270 | Fill #4

## 2016-08-17 MED FILL — aMILoride HCL 5 MG TABS: 5 | 30 days supply | Qty: 30 | Fill #5

## 2016-09-18 MED FILL — KLOR-CON M20 TABLET: 20 | 30 days supply | Qty: 270 | Fill #5

## 2016-09-23 MED FILL — aMILoride HCL 5 MG TABS: 5 | 30 days supply | Qty: 30 | Fill #0

## 2016-10-20 MED FILL — aMILoride HCL 5 MG TABS: 5 | 30 days supply | Qty: 30 | Fill #1

## 2016-10-20 MED FILL — KLOR-CON M20 TABLET: 20 | 30 days supply | Qty: 270 | Fill #0

## 2016-11-18 MED FILL — KLOR-CON M20 TABLET: 20 | 30 days supply | Qty: 270 | Fill #1

## 2016-11-18 MED FILL — aMILoride HCL 5 MG TABS: 5 | 30 days supply | Qty: 30 | Fill #2

## 2016-12-23 DIAGNOSIS — E785 Hyperlipidemia, unspecified: Secondary | ICD-10-CM | POA: Diagnosis not present

## 2016-12-23 DIAGNOSIS — E876 Hypokalemia: Secondary | ICD-10-CM | POA: Diagnosis not present

## 2016-12-24 MED FILL — POTASSIUM CL ER 20 MEQ TABL: 20 | 30 days supply | Qty: 270 | Fill #2

## 2016-12-24 MED FILL — aMILoride HCL 5 MG TABS: 5 | 30 days supply | Qty: 30 | Fill #3

## 2016-12-28 DIAGNOSIS — E162 Hypoglycemia, unspecified: Secondary | ICD-10-CM | POA: Diagnosis not present

## 2017-01-08 MED FILL — ALPRAZolam 0.5 MG TABS: 0.5 | 10 days supply | Qty: 10 | Fill #0

## 2017-01-11 MED FILL — POTASSIUM CL ER 20 MEQ TABL: 20 | 7 days supply | Qty: 63 | Fill #3

## 2017-01-11 MED FILL — aMILoride HCL 5 MG TABS: 5 | 7 days supply | Qty: 7 | Fill #4

## 2017-02-01 MED FILL — aMILoride HCL 5 MG TABS: 5 | 30 days supply | Qty: 30 | Fill #5

## 2017-02-01 MED FILL — POTASSIUM CL ER 20 MEQ TABL: 20 | 30 days supply | Qty: 270 | Fill #4

## 2017-02-02 DIAGNOSIS — R7989 Other specified abnormal findings of blood chemistry: Secondary | ICD-10-CM | POA: Diagnosis not present

## 2017-02-06 DIAGNOSIS — H6123 Impacted cerumen, bilateral: Secondary | ICD-10-CM | POA: Diagnosis not present

## 2017-02-06 DIAGNOSIS — H60392 Other infective otitis externa, left ear: Secondary | ICD-10-CM | POA: Diagnosis not present

## 2017-03-01 MED FILL — POTASSIUM CL ER 20 MEQ TABL: 20 | 30 days supply | Qty: 270 | Fill #5

## 2017-03-01 MED FILL — aMILoride HCL 5 MG TABS: 5 | 30 days supply | Qty: 30 | Fill #6

## 2017-04-01 MED FILL — aMILoride HCL 5 MG TABS: 5 | 30 days supply | Qty: 30 | Fill #0

## 2017-04-01 MED FILL — POTASSIUM CL ER 20 MEQ TABL: 20 | 30 days supply | Qty: 270 | Fill #0

## 2017-05-05 MED FILL — aMILoride HCL 5 MG TABS: 5 | 30 days supply | Qty: 30 | Fill #1

## 2017-05-05 MED FILL — POTASSIUM CL ER 20 MEQ TABL: 20 | 30 days supply | Qty: 270 | Fill #1

## 2017-06-04 MED FILL — aMILoride HCL 5 MG TABS: 5 | 30 days supply | Qty: 30 | Fill #2

## 2017-06-07 MED FILL — POTASSIUM CL ER 20 MEQ TABL: 20 | 30 days supply | Qty: 270 | Fill #2

## 2017-07-08 MED FILL — aMILoride HCL 5 MG TABS: 5 | 30 days supply | Qty: 30 | Fill #3

## 2017-07-08 MED FILL — POTASSIUM CL ER 20 MEQ TABL: 20 | 30 days supply | Qty: 270 | Fill #3

## 2017-08-09 MED FILL — aMILoride HCL 5 MG TABS: 5 | 30 days supply | Qty: 30 | Fill #4

## 2017-08-09 MED FILL — POTASSIUM CL ER 20 MEQ TABL: 20 | 30 days supply | Qty: 270 | Fill #4

## 2017-09-10 MED FILL — aMILoride HCL 5 MG TABS: 5 | 30 days supply | Qty: 30 | Fill #5

## 2017-09-13 MED FILL — POTASSIUM CL ER 20 MEQ TABL: 20 | 30 days supply | Qty: 270 | Fill #5

## 2017-10-04 DIAGNOSIS — H902 Conductive hearing loss, unspecified: Secondary | ICD-10-CM | POA: Insufficient documentation

## 2017-10-04 DIAGNOSIS — H6122 Impacted cerumen, left ear: Secondary | ICD-10-CM | POA: Diagnosis not present

## 2017-10-04 DIAGNOSIS — H9012 Conductive hearing loss, unilateral, left ear, with unrestricted hearing on the contralateral side: Secondary | ICD-10-CM | POA: Diagnosis not present

## 2017-10-11 DIAGNOSIS — R7309 Other abnormal glucose: Secondary | ICD-10-CM | POA: Diagnosis not present

## 2017-10-11 DIAGNOSIS — Z0000 Encounter for general adult medical examination without abnormal findings: Secondary | ICD-10-CM | POA: Diagnosis not present

## 2017-10-11 DIAGNOSIS — E785 Hyperlipidemia, unspecified: Secondary | ICD-10-CM | POA: Diagnosis not present

## 2017-10-11 DIAGNOSIS — R739 Hyperglycemia, unspecified: Secondary | ICD-10-CM | POA: Diagnosis not present

## 2017-10-11 DIAGNOSIS — E876 Hypokalemia: Secondary | ICD-10-CM | POA: Diagnosis not present

## 2017-10-12 MED FILL — POTASSIUM CL ER 20 MEQ TABL: 20 | 30 days supply | Qty: 270 | Fill #0

## 2017-10-12 MED FILL — aMILoride HCL 5 MG TABS: 5 | 30 days supply | Qty: 30 | Fill #6

## 2017-11-11 MED FILL — aMILoride HCL 5 MG TABS: 5 | 30 days supply | Qty: 30 | Fill #0

## 2017-11-12 MED FILL — POTASSIUM CL ER 20 MEQ TABL: 20 | 30 days supply | Qty: 270 | Fill #1

## 2017-12-10 MED FILL — aMILoride HCL 5 MG TABS: 5 | 30 days supply | Qty: 30 | Fill #1

## 2017-12-15 MED FILL — POTASSIUM CL ER 20 MEQ TABL: 20 | 30 days supply | Qty: 270 | Fill #2

## 2017-12-21 ENCOUNTER — Ambulatory Visit: Payer: 59 | Admitting: Family Medicine

## 2017-12-21 ENCOUNTER — Encounter: Payer: Self-pay | Admitting: Family Medicine

## 2017-12-21 ENCOUNTER — Other Ambulatory Visit: Payer: Self-pay

## 2017-12-21 VITALS — BP 122/86 | HR 66 | Temp 98.4°F | Ht 70.0 in | Wt 170.0 lb

## 2017-12-21 DIAGNOSIS — E2681 Bartter's syndrome: Secondary | ICD-10-CM

## 2017-12-21 DIAGNOSIS — R1013 Epigastric pain: Secondary | ICD-10-CM | POA: Diagnosis not present

## 2017-12-21 DIAGNOSIS — K8020 Calculus of gallbladder without cholecystitis without obstruction: Secondary | ICD-10-CM | POA: Insufficient documentation

## 2017-12-21 DIAGNOSIS — Z8659 Personal history of other mental and behavioral disorders: Secondary | ICD-10-CM | POA: Diagnosis not present

## 2017-12-21 HISTORY — DX: Calculus of gallbladder without cholecystitis without obstruction: K80.20

## 2017-12-21 MED ORDER — ALPRAZOLAM 0.5 MG PO TABS: 0.5000 mg | ORAL_TABLET | Freq: Every day | ORAL | 0 refills | Status: DC | PRN

## 2017-12-21 MED ORDER — RANITIDINE HCL 150 MG PO TABS: 150.0000 mg | ORAL_TABLET | Freq: Two times a day (BID) | ORAL | 0 refills | Status: DC

## 2017-12-21 MED FILL — ALPRAZolam 0.5 MG TABS: 0.5 | 10 days supply | Qty: 10 | Fill #0

## 2017-12-21 NOTE — Progress Notes (Signed)
Subjective  CC:  Chief Complaint  Patient presents with  . Establish Care    Transfer from [place], Last Physical 12/2015, wants to discuss burning pain in chest  . Medication Refill    Patient needs refill on Xanax- uses while on vacation     HPI: David Martin is a 42 y.o. male who presents to [place] at [facility] today to establish care with me. He is a former [name] patient and is here to reestablish care with me today.  Sees Dr. Lorrene Reid for mgt of his renal condition; reports most recent labwork looked great. We will request records   He has the following concerns or needs:  H/o  Hypercholesterolemia: resolved by report; eating much healthier: modified vegan.   C/o burning sensation in midepigastrium last week while at work. Bothersome, not painful, but enough for him to stop what he was doing. Had some belching. sxs recurred next day and responded to tums. Had some loose stool. No n/v or fever. Wife and son then came down with GE. No h/o GERD. No change in diet: one cup of coffee daily, occ chocolate. Has h/o asymptomatic cholelithiasis. Hasn't had any sxs in last two days. Gets intermittent diarrhea due to magnesium supplements.   H/o panic attacks: last in late 78s. Requests refill of xanax. Gave #10 in 2017. He has unused pills with him today. Would like to get renewed since these are expired; likes to have "just in case" of panic attack. No mood sxs now.   Last cpe 12/2015  We updated and reviewed the patient's past history in detail and it is documented below.  Patient Active Problem List   Diagnosis Date Noted  . Cholelithiases 12/21/2017    By xray; asymptomatic. Owens Loffler, MD GI [phone]   . History of panic attacks 12/21/2017    Keeps xanax on hand just in case; had PA in 1999 - transitioning from college back home ... None since; dad has chronic anxiety.    . Conductive hearing loss 10/04/2017  . 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 Lorrene Reid   . Refusal of statin medication by patient 01/06/2016  . IRRITABLE BOWEL SYNDROME 04/23/2008    Qualifier: Diagnosis of  By: Lenna Gilford MD, [place] Maintenance  Topic Date Due  . HIV Screening  07/21/1991  . INFLUENZA VACCINE  03/31/2018  . TETANUS/TDAP  10/29/2025   Immunization History  Administered Date(s) Administered  . Influenza Split 06/03/2012  . Influenza Whole 06/17/2010  . Influenza,inj,Quad PF,6+ Mos 05/29/2016  . Tdap 10/30/2015   Current Meds  Medication Sig  . ALPRAZolam (XANAX) 0.5 MG tablet Take 1 tablet (0.5 mg total) by mouth daily as needed for anxiety.  Marland Kitchen aMILoride (MIDAMOR) 5 MG tablet Take 5 mg by mouth daily.  . magnesium oxide (MAG-OX) 400 MG tablet Take 4 tablet by mouth 3 times daily  . potassium chloride SA (KLOR-CON M20) 20 MEQ tablet Take 60 mEq by mouth 3 (three) times daily. Take 2 tablet by mouth four times daily  . [DISCONTINUED] ALPRAZolam (XANAX) 0.5 MG tablet Take by mouth.    Allergies: Patient has No Known Allergies. Past Medical History Patient  has a past medical history of Anxiety, Cholelithiases (12/21/2017), Hyperlipidemia, Hypertension, Hypokalemia, and IBS (irritable bowel syndrome). Past Surgical History Patient  has no past surgical history on file. Family History: Patient family history includes Dementia in his maternal aunt, maternal uncle, and mother;  Diabetes in his paternal grandfather and paternal grandmother; Hyperlipidemia in his paternal grandfather and paternal grandmother; Hypertension in his father. Social History:  Patient  reports that he has never smoked. He has never used smokeless tobacco. He reports that he does not drink alcohol or use drugs.  Review of Systems: Constitutional: negative for fever or malaise Ophthalmic: negative for photophobia, double vision or loss of vision Cardiovascular: negative for chest pain, dyspnea on exertion, or new LE swelling Respiratory: negative for SOB  or persistent cough Gastrointestinal: negative for abdominal pain, change in bowel habits or melena Genitourinary: negative for dysuria or gross hematuria Musculoskeletal: negative for new gait disturbance or muscular weakness Integumentary: negative for new or persistent rashes Neurological: negative for TIA or stroke symptoms Psychiatric: negative for SI or delusions Allergic/Immunologic: negative for hives  Patient Care Team    Relationship Specialty Notifications Start End  [name], MD PCP - General Family Medicine  12/21/17     Objective  Vitals: BP 122/86   Pulse 66   Temp 98.4 F (36.9 C)   Ht 5\' 10"  (1.778 m)   Wt 170 lb (77.1 kg)   BMI 24.39 kg/m  General:  Well developed, well nourished, no acute distress  Psych:  Alert and oriented,normal mood and affect HEENT:  Normocephalic, atraumatic, non-icteric sclera, PERRL, oropharynx is without mass or exudate, supple neck without adenopathy, mass or thyromegaly Cardiovascular:  RRR without gallop, rub or murmur, nondisplaced PMI Respiratory:  Good breath sounds bilaterally, CTAB with normal respiratory effort Gastrointestinal: normal bowel sounds, soft, non-tender, no noted masses. No HSM MSK: no deformities, contusions. Joints are without erythema or swelling Skin:  Warm, no rashes or suspicious lesions noted Neurologic:    Mental status is normal. Gross motor and sensory exams are normal. Normal gait  Assessment  1. Dyspepsia   2. 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 History of panic attacks      Plan   Most c/w dypsepsia: GERD related vs infectious. Recommend monitoring sxs to see if they resolve vs worsen. If worsens, trial of zantac. F/u if needed.   Renal records to be reviewed.   Xanax refilled. Hasn't used in years.   Follow up:  1-6 months for CPE  Commons side effects, risks, benefits, and alternatives for medications and treatment plan prescribed today were discussed, and the patient expressed  understanding of the given instructions. Patient is instructed to call or message via MyChart if he/she has any questions or concerns regarding our treatment plan. No barriers to understanding were identified. We discussed Red Flag symptoms and signs in detail. Patient expressed understanding regarding what to do in case of urgent or emergency type symptoms.   Medication list was reconciled, printed and provided to the patient in AVS. Patient instructions and summary information was reviewed with the patient as documented in the AVS. This note was prepared with assistance of Dragon voice recognition software. Occasional wrong-word or sound-a-like substitutions may have occurred due to the inherent limitations of voice recognition software  No orders of the defined types were placed in this encounter.  Meds ordered this encounter  Medications  . ranitidine (ZANTAC) 150 MG tablet    Sig: Take 1 tablet (150 mg total) by mouth 2 (two) times daily.    Dispense:  60 tablet    Refill:  0  . ALPRAZolam (XANAX) 0.5 MG tablet    Sig: Take 1 tablet (0.5 mg total) by mouth daily as needed for anxiety.  Dispense:  10 tablet    Refill:  0

## 2017-12-21 NOTE — Patient Instructions (Signed)
It was so good seeing you again! Thank you for establishing with my new practice and allowing me to continue caring for you. It means a lot to me.   Please schedule a follow up appointment with me in 1-6 months for your annual complete physical; please come fasting.    Indigestion Indigestion is a feeling of pain, discomfort, burning, or fullness in the upper part of your abdomen. It can come and go. It may occur frequently or rarely. Indigestion tends to occur while you are eating or right after you have finished eating. It may be worse at night and while bending over or lying down. Follow these instructions at home: Take these actions to decrease your pain or discomfort and to help avoid complications. Diet  Follow a diet as recommended by your health care provider. This may involve avoiding foods and drinks such as: ? Coffee and tea (with or without caffeine). ? Drinks that contain alcohol. ? Energy drinks and sports drinks. ? Carbonated drinks or sodas. ? Chocolate and cocoa. ? Peppermint and mint flavorings. ? Garlic and onions. ? Horseradish. ? Spicy and acidic foods, including peppers, chili powder, curry powder, vinegar, hot sauces, and barbecue sauce. ? Citrus fruit juices and citrus fruits, such as oranges, lemons, and limes. ? Tomato-based foods, such as red sauce, chili, salsa, and pizza with red sauce. ? Fried and fatty foods, such as donuts, french fries, potato chips, and high-fat dressings. ? High-fat meats, such as hot dogs and fatty cuts of red and white meats, such as rib eye steak, sausage, ham, and bacon. ? High-fat dairy items, such as whole milk, butter, and cream cheese.  Eat small, frequent meals instead of large meals.  Avoid drinking large amounts of liquid with your meals.  Avoid eating meals during the 2-3 hours before bedtime.  Avoid lying down right after you eat.  Do not exercise right after you eat. General instructions  Pay attention to any  changes in your symptoms.  Take over-the-counter and prescription medicines only as told by your health care provider. Do not take aspirin, ibuprofen, or other NSAIDs unless your health care provider told you to do so.  Do not use any tobacco products, including cigarettes, chewing tobacco, and e-cigarettes. If you need help quitting, ask your health care provider.  Wear loose-fitting clothing. Do not wear anything tight around your waist that causes pressure on your abdomen.  Raise (elevate) the head of your bed about 6 inches (15 cm).  Try to reduce your stress, such as with yoga or meditation. If you need help reducing stress, ask your health care provider.  If you are overweight, reduce your weight to an amount that is healthy for you. Ask your health care provider for guidance about a safe weight loss goal.  Keep all follow-up visits as told by your health care provider. This is important. Contact a health care provider if:  You have new symptoms.  You have unexplained weight loss.  You have difficulty swallowing, or it hurts to swallow.  Your symptoms do not improve with treatment.  Your symptoms last for more than two days.  You have a fever.  You vomit. Get help right away if:  You have pain in your arms, neck, jaw, teeth, or back.  You feel sweaty, dizzy, or light-headed.  You faint.  You have chest pain or shortness of breath.  You cannot stop vomiting, or you vomit blood.  Your stool is bloody or black.  You  have severe pain in your abdomen. This information is not intended to replace advice given to you by your health care provider. Make sure you discuss any questions you have with your health care provider. Document Released: 09/24/2004 Document Revised: 01/23/2016 Document Reviewed: 12/12/2014 Elsevier Interactive Patient Education  [employer].

## 2018-01-07 MED FILL — aMILoride HCL 5 MG TABS: 5 | 30 days supply | Qty: 30 | Fill #2

## 2018-01-08 MED FILL — POTASSIUM CL ER 20 MEQ TABL: 20 | 30 days supply | Qty: 270 | Fill #3

## 2018-02-14 MED FILL — aMILoride HCL 5 MG TABS: 5 | 30 days supply | Qty: 30 | Fill #3

## 2018-02-21 MED FILL — POTASSIUM CL ER 20 MEQ TABL: 20 | 30 days supply | Qty: 270 | Fill #4

## 2018-03-11 DIAGNOSIS — H6123 Impacted cerumen, bilateral: Secondary | ICD-10-CM | POA: Diagnosis not present

## 2018-03-15 MED FILL — aMILoride HCL 5 MG TABS: 5 | 30 days supply | Qty: 30 | Fill #4

## 2018-03-28 MED FILL — POTASSIUM CL ER 20 MEQ TAB: 20 | 30 days supply | Qty: 270 | Fill #5

## 2018-03-31 ENCOUNTER — Other Ambulatory Visit: Payer: Self-pay

## 2018-03-31 ENCOUNTER — Encounter: Payer: Self-pay | Admitting: Family Medicine

## 2018-03-31 ENCOUNTER — Ambulatory Visit (INDEPENDENT_AMBULATORY_CARE_PROVIDER_SITE_OTHER): Payer: 59 | Admitting: Family Medicine

## 2018-03-31 VITALS — BP 124/86 | HR 97 | Temp 99.4°F | Ht 70.0 in | Wt 171.6 lb

## 2018-03-31 DIAGNOSIS — H6981 Other specified disorders of Eustachian tube, right ear: Secondary | ICD-10-CM | POA: Diagnosis not present

## 2018-03-31 NOTE — Patient Instructions (Addendum)
Please return in Sept - December for you complete physical. Please schedule.   If you have any questions or concerns, please don't hesitate to send me a message via MyChart or call the office at [phone]. Thank you for visiting with [place] today! It's our pleasure caring for you.  Start sudafed as needed and daily zyrtec or allegra for the next several weeks.  You may also try the valsalva maneuver: close your mouth and plug your nose and blow to open up the tube.   Eustachian Tube Dysfunction The eustachian tube connects the middle ear to the back of the nose. It regulates air pressure in the middle ear by allowing air to move between the ear and nose. It also helps to drain fluid from the middle ear space. When the eustachian tube does not function properly, air pressure, fluid, or both can build up in the middle ear. Eustachian tube dysfunction can affect one or both ears. What are the causes? This condition happens when the eustachian tube becomes blocked or cannot open normally. This may result from:  Ear infections.  Colds and other upper respiratory infections.  Allergies.  Irritation, such as from cigarette smoke or acid from the stomach coming up into the esophagus (gastroesophageal reflux).  Sudden changes in air pressure, such as from descending in an airplane.  Abnormal growths in the nose or throat, such as nasal polyps, tumors, or enlarged tissue at the back of the throat (adenoids).  What increases the risk? This condition may be more likely to develop in people who smoke and people who are overweight. Eustachian tube dysfunction may also be more likely to develop in children, especially children who have:  Certain birth defects of the mouth, such as cleft palate.  Large tonsils and adenoids.  What are the signs or symptoms? Symptoms of this condition may include:  A feeling of fullness in the ear.  Ear pain.  Clicking or popping noises in the ear.  Ringing in  the ear.  Hearing loss.  Loss of balance.  Symptoms may get worse when the air pressure around you changes, such as when you travel to an area of high elevation or fly on an airplane. How is this diagnosed? This condition may be diagnosed based on:  Your symptoms.  A physical exam of your ear, nose, and throat.  Tests, such as those that measure: ? The movement of your eardrum (tympanogram). ? Your hearing (audiometry).  How is this treated? Treatment depends on the cause and severity of your condition. If your symptoms are mild, you may be able to relieve your symptoms by moving air into ("popping") your ears. If you have symptoms of fluid in your ears, treatment may include:  Decongestants.  Antihistamines.  Nasal sprays or ear drops that contain medicines that reduce swelling (steroids).  In some cases, you may need to have a procedure to drain the fluid in your eardrum (myringotomy). In this procedure, a small tube is placed in the eardrum to:  Drain the fluid.  Restore the air in the middle ear space.  Follow these instructions at home:  Take over-the-counter and prescription medicines only as told by your health care provider.  Use techniques to help pop your ears as recommended by your health care provider. These may include: ? Chewing gum. ? Yawning. ? Frequent, forceful swallowing. ? Closing your mouth, holding your nose closed, and gently blowing as if you are trying to blow air out of your nose.  Do  not do any of the following until your health care provider approves: ? Travel to high altitudes. ? Fly in airplanes. ? Work in a Pension scheme manager or room. ? Scuba dive.  Keep your ears dry. Dry your ears completely after showering or bathing.  Do not smoke.  Keep all follow-up visits as told by your health care provider. This is important. Contact a health care provider if:  Your symptoms do not go away after treatment.  Your symptoms come back after  treatment.  You are unable to pop your ears.  You have: ? A fever. ? Pain in your ear. ? Pain in your head or neck. ? Fluid draining from your ear.  Your hearing suddenly changes.  You become very dizzy.  You lose your balance. This information is not intended to replace advice given to you by your health care provider. Make sure you discuss any questions you have with your health care provider. Document Released: 09/13/2015 Document Revised: 01/23/2016 Document Reviewed: 09/05/2014 Elsevier Interactive Patient Education  [employer].

## 2018-03-31 NOTE — Progress Notes (Signed)
Subjective  CC:  Chief Complaint  Patient presents with  . Ear Problem    Left ear has been clogged x 2 days   2HPI: David Martin is a 42 y.o. male who presents to the office today to address the problems listed above in the chief complaint.  Feels pressure and clogged left ear. No pain. No f/c/s. Thinks he has wax. Hearing fine.    Assessment  1. Dysfunction of right eustachian tube      Plan   ETD:  Educated. Sudafed and allegra. Valsalva. Monitor. Add steroids if persists.   Follow up: Return in about 3 months (around 07/01/2018) for complete physical.   No orders of the defined types were placed in this encounter.  No orders of the defined types were placed in this encounter.     I reviewed the patients updated PMH, FH, and SocHx.    Patient Active Problem List   Diagnosis Date Noted  . Cholelithiases 12/21/2017  . History of panic attacks 12/21/2017  . Conductive hearing loss 10/04/2017  . 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 Refusal of statin medication by patient 01/06/2016  . IRRITABLE BOWEL SYNDROME 04/23/2008   Current Meds  Medication Sig  . ALPRAZolam (XANAX) 0.5 MG tablet Take 1 tablet (0.5 mg total) by mouth daily as needed for anxiety.  Marland Kitchen aMILoride (MIDAMOR) 5 MG tablet Take 5 mg by mouth daily.  . magnesium oxide (MAG-OX) 400 MG tablet Take 4 tablet by mouth 3 times daily  . potassium chloride SA (KLOR-CON M20) 20 MEQ tablet Take 60 mEq by mouth 3 (three) times daily. Take 2 tablet by mouth four times daily  . [DISCONTINUED] Omega-3 Fatty Acids (OMEGA-3 FISH OIL PO) Take 1,500 mg by mouth daily.    Allergies: Patient has No Known Allergies. Family History: Patient family history includes Dementia in his maternal aunt, maternal uncle, and mother; Diabetes in his paternal grandfather and paternal grandmother; Hyperlipidemia in his paternal grandfather and paternal grandmother; Hypertension in his father. Social History:  Patient  reports that  he has never smoked. He has never used smokeless tobacco. He reports that he does not drink alcohol or use drugs.  Review of Systems: Constitutional: Negative for fever malaise or anorexia Cardiovascular: negative for chest pain Respiratory: negative for SOB or persistent cough Gastrointestinal: negative for abdominal pain  Objective  Vitals: BP 124/86   Pulse 97   Temp 99.4 F (37.4 C)   Ht 5\' 10"  (1.778 m)   Wt 171 lb 9.6 oz (77.8 kg)   SpO2 99%   BMI 24.62 kg/m  General: no acute distress , A&Ox3 HEENT: PEERL, conjunctiva normal, Oropharynx moist,neck is supple, right TM nl; left TM is retracted with effusion, non suppurative, EAC nl    Commons side effects, risks, benefits, and alternatives for medications and treatment plan prescribed today were discussed, and the patient expressed understanding of the given instructions. Patient is instructed to call or message via MyChart if he/she has any questions or concerns regarding our treatment plan. No barriers to understanding were identified. We discussed Red Flag symptoms and signs in detail. Patient expressed understanding regarding what to do in case of urgent or emergency type symptoms.   Medication list was reconciled, printed and provided to the patient in AVS. Patient instructions and summary information was reviewed with the patient as documented in the AVS. This note was prepared with assistance of Dragon voice recognition software. Occasional wrong-word or sound-a-like substitutions may have occurred due  to the inherent limitations of voice recognition software     

## 2018-04-18 MED FILL — aMILoride HCL 5 MG TABS: 5 | 30 days supply | Qty: 30 | Fill #5

## 2018-05-03 MED FILL — POTASSIUM CL ER 20 MEQ TAB: 20 | 30 days supply | Qty: 270 | Fill #0

## 2018-05-20 MED FILL — aMILoride HCL 5 MG TABS: 5 | 30 days supply | Qty: 30 | Fill #6

## 2018-06-07 MED FILL — POTASSIUM CL ER 20 MEQ TAB: 20 | 30 days supply | Qty: 270 | Fill #1

## 2018-06-27 MED FILL — aMILoride HCL 5 MG TABS: 5 | 30 days supply | Qty: 30 | Fill #0

## 2018-06-28 DIAGNOSIS — Z5320 Procedure and treatment not carried out because of patient's decision for unspecified reasons: Secondary | ICD-10-CM | POA: Diagnosis not present

## 2018-06-28 DIAGNOSIS — R7309 Other abnormal glucose: Secondary | ICD-10-CM | POA: Diagnosis not present

## 2018-06-28 DIAGNOSIS — E876 Hypokalemia: Secondary | ICD-10-CM | POA: Diagnosis not present

## 2018-06-28 LAB — HEPATIC FUNCTION PANEL
ALT: 36 (ref 10–40)
AST: 24 (ref 14–40)

## 2018-06-28 LAB — BASIC METABOLIC PANEL
BUN: 15 (ref 4–21)
Creatinine: 1.2 (ref <=1.3)
GLUCOSE: 112
Potassium: 3.4 (ref 3.4–5.3)
Sodium: 140 (ref 137–147)

## 2018-06-28 LAB — LIPID PANEL
Cholesterol: 252 — AB (ref 0–200)
HDL: 66 (ref 35–70)
LDL CALC: 152
TRIGLYCERIDES: 168 — AB (ref 40–160)

## 2018-06-28 LAB — HEMOGLOBIN A1C: HEMOGLOBIN A1C: 6.1

## 2018-06-30 ENCOUNTER — Encounter: Payer: Self-pay | Admitting: Emergency Medicine

## 2018-07-07 MED FILL — POTASSIUM CL ER 20 MEQ TAB: 20 | 30 days supply | Qty: 270 | Fill #2

## 2018-07-29 MED FILL — aMILoride HCL 5 MG TABS: 5 | 30 days supply | Qty: 30 | Fill #1

## 2018-08-11 MED FILL — POTASSIUM CHLORIDE CRYS ER: 20 | 30 days supply | Qty: 270 | Fill #3

## 2018-09-01 MED FILL — aMILoride HCL 5 MG TABS: 5 | 30 days supply | Qty: 30 | Fill #2

## 2018-09-19 MED FILL — POTASSIUM CHLORIDE CRYS ER: 20 | 30 days supply | Qty: 270 | Fill #4

## 2018-10-10 MED FILL — aMILoride HCL 5 MG TABS: 5 | 30 days supply | Qty: 30 | Fill #3

## 2018-10-19 MED FILL — POTASSIUM CHLORIDE CRYS ER: 20 | 30 days supply | Qty: 270 | Fill #5

## 2018-11-14 MED FILL — aMILoride HCL 5 MG TABS: 5 | 30 days supply | Qty: 30 | Fill #4 | Status: TO

## 2018-11-24 MED FILL — POTASSIUM CHLORIDE CRYS ER: 20 | 30 days supply | Qty: 270 | Fill #0

## 2018-12-19 MED FILL — aMILoride HCL 5 MG TABS: 5 | 30 days supply | Qty: 30 | Fill #0

## 2018-12-24 MED FILL — POTASSIUM CHLORIDE CRYS ER: 20 | 30 days supply | Qty: 270 | Fill #1

## 2019-01-04 DIAGNOSIS — R7309 Other abnormal glucose: Secondary | ICD-10-CM | POA: Diagnosis not present

## 2019-01-04 DIAGNOSIS — E876 Hypokalemia: Secondary | ICD-10-CM | POA: Diagnosis not present

## 2019-01-04 DIAGNOSIS — Z5320 Procedure and treatment not carried out because of patient's decision for unspecified reasons: Secondary | ICD-10-CM | POA: Diagnosis not present

## 2019-01-04 DIAGNOSIS — F410 Panic disorder [episodic paroxysmal anxiety] without agoraphobia: Secondary | ICD-10-CM | POA: Diagnosis not present

## 2019-01-12 MED FILL — ALPRAZolam 0.5 MG TABS: 0.5 | 15 days supply | Qty: 15 | Fill #0

## 2019-01-12 MED FILL — aMILoride HCL 5 MG TABS: 5 | 30 days supply | Qty: 30 | Fill #0

## 2019-01-26 MED FILL — POTASSIUM CHLORIDE CRYS ER: 20 | 30 days supply | Qty: 270 | Fill #2

## 2019-02-20 MED FILL — aMILoride HCL 5 MG TABS: 5 | 30 days supply | Qty: 30 | Fill #1

## 2019-02-23 DIAGNOSIS — H6123 Impacted cerumen, bilateral: Secondary | ICD-10-CM | POA: Diagnosis not present

## 2019-03-07 MED FILL — POTASSIUM CHLORIDE CRYS ER: 20 | 30 days supply | Qty: 270 | Fill #3

## 2019-03-27 MED FILL — aMILoride HCL 5 MG TABS: 5 | 30 days supply | Qty: 30 | Fill #2

## 2019-04-12 MED FILL — POTASSIUM CHLORIDE CRYS ER: 20 | 30 days supply | Qty: 270 | Fill #4

## 2019-04-28 MED FILL — aMILoride HCL 5 MG TABS: 5 | 30 days supply | Qty: 30 | Fill #3

## 2019-05-15 MED FILL — POTASSIUM CHLORIDE CRYS ER: 20 | 30 days supply | Qty: 270 | Fill #5

## 2019-06-05 MED FILL — aMILoride HCL 5 MG TABS: 5 | 30 days supply | Qty: 30 | Fill #4

## 2019-06-19 MED FILL — POTASSIUM CHLORIDE CRYS ER: 20 | 30 days supply | Qty: 270 | Fill #0

## 2019-07-06 MED FILL — aMILoride HCL 5 MG TABS: 5 | 30 days supply | Qty: 30 | Fill #5

## 2019-07-19 DIAGNOSIS — Z5320 Procedure and treatment not carried out because of patient's decision for unspecified reasons: Secondary | ICD-10-CM | POA: Diagnosis not present

## 2019-07-19 DIAGNOSIS — R7309 Other abnormal glucose: Secondary | ICD-10-CM | POA: Diagnosis not present

## 2019-07-19 DIAGNOSIS — E876 Hypokalemia: Secondary | ICD-10-CM | POA: Diagnosis not present

## 2019-07-23 MED FILL — POTASSIUM CHLORIDE CRYS ER: 20 | 30 days supply | Qty: 270 | Fill #1

## 2019-08-08 MED FILL — aMILoride HCL 5 MG TABS: 5 | 30 days supply | Qty: 30 | Fill #6

## 2019-08-23 MED FILL — POTASSIUM CHLORIDE CRYS ER: 20 | 30 days supply | Qty: 270 | Fill #2

## 2019-09-11 ENCOUNTER — Other Ambulatory Visit (HOSPITAL_COMMUNITY): Payer: Self-pay | Admitting: Nephrology

## 2019-09-11 MED FILL — aMILoride HCL 5 MG TABS: 5 | 30 days supply | Qty: 30 | Fill #0

## 2019-09-27 MED FILL — POTASSIUM CHLORIDE CRYS ER: 20 | 30 days supply | Qty: 270 | Fill #3

## 2019-10-12 MED FILL — aMILoride HCL 5 MG TABS: 5 | 30 days supply | Qty: 30 | Fill #1

## 2019-10-18 DIAGNOSIS — H6123 Impacted cerumen, bilateral: Secondary | ICD-10-CM | POA: Diagnosis not present

## 2019-10-20 DIAGNOSIS — H6982 Other specified disorders of Eustachian tube, left ear: Secondary | ICD-10-CM | POA: Diagnosis not present

## 2019-10-20 DIAGNOSIS — H60542 Acute eczematoid otitis externa, left ear: Secondary | ICD-10-CM | POA: Insufficient documentation

## 2019-10-20 DIAGNOSIS — H6992 Unspecified Eustachian tube disorder, left ear: Secondary | ICD-10-CM | POA: Insufficient documentation

## 2019-10-20 MED FILL — ALCLOMETASONE DIPRO 0.05% C: 0.05 | 14 days supply | Qty: 15 | Fill #0

## 2019-10-20 MED FILL — FLUTICASONE PROP 50 MCG SPR: 50 | 30 days supply | Qty: 16 | Fill #0

## 2019-10-24 ENCOUNTER — Ambulatory Visit: Payer: 59 | Admitting: Family Medicine

## 2019-10-24 ENCOUNTER — Encounter: Payer: Self-pay | Admitting: Family Medicine

## 2019-10-24 ENCOUNTER — Other Ambulatory Visit: Payer: Self-pay

## 2019-10-24 VITALS — BP 128/78 | HR 74 | Temp 98.4°F | Ht 70.0 in | Wt 165.2 lb

## 2019-10-24 DIAGNOSIS — E782 Mixed hyperlipidemia: Secondary | ICD-10-CM | POA: Diagnosis not present

## 2019-10-24 DIAGNOSIS — F410 Panic disorder [episodic paroxysmal anxiety] without agoraphobia: Secondary | ICD-10-CM | POA: Diagnosis not present

## 2019-10-24 DIAGNOSIS — H6983 Other specified disorders of Eustachian tube, bilateral: Secondary | ICD-10-CM | POA: Diagnosis not present

## 2019-10-24 DIAGNOSIS — E2681 Bartter's syndrome: Secondary | ICD-10-CM | POA: Diagnosis not present

## 2019-10-24 LAB — CBC WITH DIFFERENTIAL/PLATELET
Basophils Absolute: 0.1 10*3/uL (ref 0.0–0.1)
Basophils Relative: 0.7 % (ref 0.0–3.0)
Eosinophils Absolute: 0 10*3/uL (ref 0.0–0.7)
Eosinophils Relative: 0.3 % (ref 0.0–5.0)
HCT: 44.4 % (ref 39.0–52.0)
Hemoglobin: 14.3 g/dL (ref 13.0–17.0)
Lymphocytes Relative: 24.3 % (ref 12.0–46.0)
Lymphs Abs: 2.2 10*3/uL (ref 0.7–4.0)
MCHC: 32.2 g/dL (ref 30.0–36.0)
MCV: 79.7 fl (ref 78.0–100.0)
Monocytes Absolute: 0.7 10*3/uL (ref 0.1–1.0)
Monocytes Relative: 8.2 % (ref 3.0–12.0)
Neutro Abs: 6 10*3/uL (ref 1.4–7.7)
Neutrophils Relative %: 66.5 % (ref 43.0–77.0)
Platelets: 303 10*3/uL (ref 150.0–400.0)
RBC: 5.57 Mil/uL (ref 4.22–5.81)
RDW: 13.7 % (ref 11.5–15.5)
WBC: 9.1 10*3/uL (ref 4.0–10.5)

## 2019-10-24 LAB — COMPREHENSIVE METABOLIC PANEL
ALT: 21 U/L (ref 0–53)
AST: 17 U/L (ref 0–37)
Albumin: 5.1 g/dL (ref 3.5–5.2)
Alkaline Phosphatase: 59 U/L (ref 39–117)
BUN: 22 mg/dL (ref 6–23)
CO2: 32 mEq/L (ref 19–32)
Calcium: 10.2 mg/dL (ref 8.4–10.5)
Chloride: 98 mEq/L (ref 96–112)
Creatinine, Ser: 1.46 mg/dL (ref 0.40–1.50)
GFR: 63.68 mL/min (ref >60.00)
Glucose, Bld: 107 mg/dL — ABNORMAL HIGH (ref 70–99)
Potassium: 4.2 mEq/L (ref 3.5–5.1)
Sodium: 139 mEq/L (ref 135–145)
Total Bilirubin: 0.8 mg/dL (ref 0.2–1.2)
Total Protein: 7.8 g/dL (ref 6.0–8.3)

## 2019-10-24 LAB — TSH: TSH: 1.65 u[IU]/mL (ref 0.35–4.50)

## 2019-10-24 LAB — LIPID PANEL
Cholesterol: 215 mg/dL — ABNORMAL HIGH (ref 0–200)
HDL: 72.8 mg/dL (ref >39.00)
LDL Cholesterol: 123 mg/dL — ABNORMAL HIGH (ref 0–99)
NonHDL: 142.22
Total CHOL/HDL Ratio: 3
Triglycerides: 98 mg/dL (ref 0.0–149.0)
VLDL: 19.6 mg/dL (ref 0.0–40.0)

## 2019-10-24 NOTE — Progress Notes (Signed)
Subjective  CC:  Chief Complaint  Patient presents with  . ears clogged    requesting to have both ears checked. seen ENT last week. Requesting blood work    HPI: David Martin is a 44 y.o. male who presents to the office today to address the problems listed above in the chief complaint.  Here for recurrent concerns over ears. I reviewed my last notes and recent evaluations by ENT. Has mild ETD, allergies and gets panicked whenever he feels fullness in his ears. Reports panic attack like sxs. Missed 2 days of work last week due to concerns/fear/worries over ear sensations. He has been evaluated multiple times for ears and has been reassured. He describes worry over anything that could negatively affect his health.   Has used zoloft years ago for worry but prefers to not "take medications". Has xanax "for comfort" but never uses it. Denies depression. Denies worry over other things.   HM: overdue for cpe.   Chronic problems: reviewed renal notes. H/o HLD and would like fasting lab work done today.   Healthy lifestyle. He is a respiratory therapist.    Assessment  1. Panic disorder   2. ETD (Eustachian tube dysfunction), bilateral   3. Mixed hyperlipidemia   4. 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 Suggest treatment: discussed CBT, medications. Pt will think about it. Has xanax on hand if needed.   ETD and allergies: continue allegra and flonase daily. This should help is sxs greatly.   HLD: recheck today  Bartters syndrome: followed by renal and has been stable.   Needs cpe visits annually. Reinforced.   Follow up: No follow-ups on file.  Visit date not found  Orders Placed This Encounter  Procedures  . CBC with Differential/Platelet  . Comprehensive metabolic panel  . Lipid panel  . TSH   No orders of the defined types were placed in this  encounter.     I reviewed the patients updated PMH, FH, and SocHx.    Patient Active Problem List   Diagnosis Date Noted  . Cholelithiases 12/21/2017  . History of panic attacks 12/21/2017  . Conductive hearing loss 10/04/2017  . 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 Refusal of statin medication by patient 01/06/2016  . IRRITABLE BOWEL SYNDROME 04/23/2008   Current Meds  Medication Sig  . alclomethasone (ACLOVATE) 0.05 % cream Apply 1 application topically 2 (two) times daily.  Marland Kitchen ALPRAZolam (XANAX) 0.5 MG tablet Take 1 tablet (0.5 mg total) by mouth daily as needed for anxiety.  Marland Kitchen aMILoride (MIDAMOR) 5 MG tablet Take 5 mg by mouth daily.  . fluticasone (FLONASE) 50 MCG/ACT nasal spray Place 2 sprays into both nostrils daily.  . magnesium oxide (MAG-OX) 400 MG tablet Take 4 tablet by mouth 3 times daily  . potassium chloride SA (KLOR-CON M20) 20 MEQ tablet Take 60 mEq by mouth 3 (three) times daily. Take 2 tablet by mouth four times daily    Allergies: Patient has No Known Allergies. Family History: Patient family history includes Dementia in his maternal aunt, maternal uncle, and mother; Diabetes in his paternal grandfather and paternal grandmother; Hyperlipidemia in his paternal grandfather and paternal grandmother; Hypertension in his father. Social History:  Patient  reports that he has never smoked. He has never used smokeless tobacco. He reports that he does not drink alcohol or use drugs.  Review  of Systems: Constitutional: Negative for fever malaise or anorexia Cardiovascular: negative for chest pain Respiratory: negative for SOB or persistent cough Gastrointestinal: negative for abdominal pain  Objective  Vitals: BP 128/78 (BP Location: Left Arm, Patient Position: Sitting, Cuff Size: Normal)   Pulse 74   Temp 98.4 F (36.9 C) (Temporal)   Ht 5\' 10"  (1.778 m)   Wt 165 lb 3.2 oz (74.9 kg)   SpO2 97%   BMI 23.70 kg/m  General: anxious , A&Ox3, appears fit Psych:  anxious mood; limited insight. Normal speech. HEENT: TMs nl bilaterally      Commons side effects, risks, benefits, and alternatives for medications and treatment plan prescribed today were discussed, and the patient expressed understanding of the given instructions. Patient is instructed to call or message via MyChart if he/she has any questions or concerns regarding our treatment plan. No barriers to understanding were identified. We discussed Red Flag symptoms and signs in detail. Patient expressed understanding regarding what to do in case of urgent or emergency type symptoms.   Medication list was reconciled, printed and provided to the patient in AVS. Patient instructions and summary information was reviewed with the patient as documented in the AVS. This note was prepared with assistance of Dragon voice recognition software. Occasional wrong-word or sound-a-like substitutions may have occurred due to the inherent limitations of voice recognition software  This visit occurred during the SARS-CoV-2 public health emergency.  Safety protocols were in place, including screening questions prior to the visit, additional usage of staff PPE, and extensive cleaning of exam room while observing appropriate contact time as indicated for disinfecting solutions.

## 2019-10-24 NOTE — Patient Instructions (Addendum)
Please return for your annual complete physical; please come fasting.  I will release your lab results to you on your MyChart account with further instructions. Please reply with any questions.   Please call [facility] to schedule an appointment with Dr. Trey Paula; she is a therapist here at our [place] office.  The phone number is: [phone]   If you have any questions or concerns, please don't hesitate to send me a message via MyChart or call the office at [phone]. Thank you for visiting with David Martin today! It's our pleasure caring for you.   Panic Attack  A panic attack is when you suddenly feel very afraid, uncomfortable, or nervous (anxious). A panic attack can happen when you are scared or for no reason. A panic attack can feel like a serious problem. It can even feel like a heart attack or stroke. See your doctor when you have a panic attack to make sure you do not have a serious problem. Follow these instructions at home:  Take medicines only as told by your doctor.  If you feel worried or nervous, try not to have caffeine.  Take good care of your health. To do this: ? Eat healthy. Make sure to eat fresh fruits and vegetables, whole grains, lean meats, and low-fat dairy. ? Get enough sleep. Try to sleep for 7-8 hours each night. ? Exercise. Try to be active for 30 minutes 5 or more days a week. ? Do not smoke. Talk to your doctor if you need help quitting. ? Limit how much alcohol you drink:  If you are a woman who is not pregnant: try not to have more than 1 drink a day.  If you are a man: try not to have more than 2 drinks a day.  One drink equals 12 oz of beer, 5 oz of wine, or 1 oz of hard liquor.  Keep all follow-up visits as told by your doctor. This is important. Contact a doctor if:  Your symptoms do not get better.  Your symptoms get worse.  You are not able to take your medicines as told. Get help right away if:  You  have thoughts of hurting yourself or others.  You have symptoms of a panic attack. Do not drive yourself to the hospital. Have someone else drive you or call an ambulance. If you feel like you may hurt yourself or others, or have thoughts about taking your own life, get help right away. You can go to your nearest emergency department or call:  Your local emergency services (911 in the [place]  A suicide crisis helpline, such as the [facility] at 509-795-7296. This is open 24 hours a day. Summary  A panic attack is when you suddenly feel very afraid, uncomfortable, or nervous (anxious).  See your doctor when you have a panic attack to make sure that you do not have another serious problem.  If you feel like you may hurt yourself or others, get help right away by calling 911. This information is not intended to replace advice given to you by your health care provider. Make sure you discuss any questions you have with your health care provider. Document Revised: 07/30/2017 Document Reviewed: 09/30/2016 Elsevier Patient Education  [place]. Panic Attack A panic attack is a sudden episode of severe anxiety, fear, or discomfort that causes physical and emotional symptoms. The attack may be in response to something frightening, or it may occur for  no known reason. Symptoms of a panic attack can be similar to symptoms of a heart attack or stroke. It is important to see your health care provider when you have a panic attack so that these conditions can be ruled out. A panic attack is a symptom of another condition. Most panic attacks go away with treatment of the underlying problem. If you have panic attacks often, you may have a condition called panic disorder. What are the causes? A panic attack may be caused by:  An extreme, life-threatening situation, such as a war or natural disaster.  An anxiety disorder, such as post-traumatic stress  disorder.  Depression.  Certain medical conditions, including heart problems, neurological conditions, and infections.  Certain over-the-counter and prescription medicines.  Illegal drugs that increase heart rate and blood pressure, such as methamphetamine.  Alcohol.  Supplements that increase anxiety.  Panic disorder. What increases the risk? You are more likely to develop this condition if:  You have an anxiety disorder.  You have another mental health condition.  You take certain medicines.  You use alcohol, illegal drugs, or other substances.  You are under extreme stress.  A life event is causing increased feelings of anxiety and depression. What are the signs or symptoms? A panic attack starts suddenly, usually lasts about 20 minutes, and occurs with one or more of the following:  A pounding heart.  A feeling that your heart is beating irregularly or faster than normal (palpitations).  Sweating.  Trembling or shaking.  Shortness of breath or feeling smothered.  Feeling choked.  Chest pain or discomfort.  Nausea or a strange feeling in your stomach.  Dizziness, feeling lightheaded, or feeling like you might faint.  Chills or hot flashes.  Numbness or tingling in your lips, hands, or feet.  Feeling confused, or feeling that you are not yourself.  Fear of losing control or being emotionally unstable.  Fear of dying. How is this diagnosed? A panic attack is diagnosed with an assessment by your health care provider. During the assessment your health care provider will ask questions about:  Your history of anxiety, depression, and panic attacks.  Your medical history.  Whether you drink alcohol, use illegal drugs, take supplements, or take medicines. Be honest about your substance use. Your health care provider may also:  Order blood tests or other kinds of tests to rule out serious medical conditions.  Refer you to a mental health professional  for further evaluation. How is this treated? Treatment depends on the cause of the panic attack:  If the cause is a medical problem, your health care provider will either treat that problem or refer you to a specialist.  If the cause is emotional, you may be given anti-anxiety medicines or referred to a counselor. These medicines may reduce how often attacks happen, reduce how severe the attacks are, and lower anxiety.  If the cause is a medicine, your health care provider may tell you to stop the medicine, change your dose, or take a different medicine.  If the cause is a drug, treatment may involve letting the drug wear off and taking medicine to help the drug leave your body or to counteract its effects. Attacks caused by drug abuse may continue even if you stop using the drug. Follow these instructions at home:  Take over-the-counter and prescription medicines only as told by your health care provider.  If you feel anxious, limit your caffeine intake.  Take good care of your physical and mental  health by: ? Eating a balanced diet that includes plenty of fresh fruits and vegetables, whole grains, lean meats, and low-fat dairy. ? Getting plenty of rest. Try to get 7-8 hours of uninterrupted sleep each night. ? Exercising regularly. Try to get 30 minutes of physical activity at least 5 days a week. ? Not smoking. Talk to your health care provider if you need help quitting. ? Limiting alcohol intake to no more than 1 drink a day for nonpregnant women and 2 drinks a day for men. One drink equals 12 oz of beer, 5 oz of wine, or 1 oz of hard liquor.  Keep all follow-up visits as told by your health care provider. This is important. Panic attacks may have underlying physical or emotional problems that take time to accurately diagnose. Contact a health care provider if:  Your symptoms do not improve, or they get worse.  You are not able to take your medicine as prescribed because of side  effects. Get help right away if:  You have serious thoughts about hurting yourself or others.  You have symptoms of a panic attack. Do not drive yourself to the hospital. Have someone else drive you or call an ambulance. If you ever feel like you may hurt yourself or others, or you have thoughts about taking your own life, get help right away. You can go to your nearest emergency department or call:  Your local emergency services (911 in the [place]  A suicide crisis helpline, such as the [facility] at 252 484 5188. This is open 24 hours a day. Summary  A panic attack is a sign of a serious health or mental health condition. Get help right away. Do not drive yourself to the hospital. Have someone else drive you or call an ambulance.  Always see a health care provider to have the reasons for the panic attack correctly diagnosed.  If your panic attack was caused by a physical problem, follow your health care provider's suggestions for medicine, referral to a specialist, and lifestyle changes.  If your panic attack was caused by an emotional problem, follow through with counseling from a qualified mental health specialist.  If you feel like you may hurt yourself or others, call 911 and get help right away. This information is not intended to replace advice given to you by your health care provider. Make sure you discuss any questions you have with your health care provider. Document Revised: 07/30/2017 Document Reviewed: 09/25/2016 Elsevier Patient Education  2020 [employer].

## 2019-10-31 MED FILL — POTASSIUM CHLORIDE CRYS ER: 20 | 30 days supply | Qty: 270 | Fill #4

## 2019-11-09 DIAGNOSIS — H6982 Other specified disorders of Eustachian tube, left ear: Secondary | ICD-10-CM | POA: Diagnosis not present

## 2019-11-14 MED FILL — aMILoride HCL 5 MG TABS: 5 | 30 days supply | Qty: 30 | Fill #2

## 2019-11-21 MED FILL — FLUTICASONE PROP 50 MCG SPR: 50 | 30 days supply | Qty: 16 | Fill #1

## 2019-11-30 MED FILL — POTASSIUM CHLORIDE CRYS ER: 20 | 30 days supply | Qty: 270 | Fill #5

## 2019-12-11 DIAGNOSIS — F410 Panic disorder [episodic paroxysmal anxiety] without agoraphobia: Secondary | ICD-10-CM | POA: Diagnosis not present

## 2019-12-11 DIAGNOSIS — F411 Generalized anxiety disorder: Secondary | ICD-10-CM | POA: Diagnosis not present

## 2019-12-11 MED FILL — ALPRAZolam 0.5 MG TABS: 0.5 | 14 days supply | Qty: 14 | Fill #0

## 2019-12-14 MED FILL — aMILoride HCL 5 MG TABS: 5 | 30 days supply | Qty: 30 | Fill #3

## 2020-01-02 DIAGNOSIS — H6982 Other specified disorders of Eustachian tube, left ear: Secondary | ICD-10-CM | POA: Diagnosis not present

## 2020-01-03 ENCOUNTER — Other Ambulatory Visit (HOSPITAL_COMMUNITY): Payer: Self-pay | Admitting: Nephrology

## 2020-01-03 MED FILL — POTASSIUM CHLORIDE CRYS ER: 20 | 30 days supply | Qty: 270 | Fill #0

## 2020-01-05 DIAGNOSIS — F410 Panic disorder [episodic paroxysmal anxiety] without agoraphobia: Secondary | ICD-10-CM | POA: Diagnosis not present

## 2020-01-05 DIAGNOSIS — F411 Generalized anxiety disorder: Secondary | ICD-10-CM | POA: Diagnosis not present

## 2020-01-08 DIAGNOSIS — R7309 Other abnormal glucose: Secondary | ICD-10-CM | POA: Diagnosis not present

## 2020-01-08 DIAGNOSIS — E876 Hypokalemia: Secondary | ICD-10-CM | POA: Diagnosis not present

## 2020-01-08 DIAGNOSIS — Z5320 Procedure and treatment not carried out because of patient's decision for unspecified reasons: Secondary | ICD-10-CM | POA: Diagnosis not present

## 2020-01-24 MED FILL — POTASSIUM CHLORIDE CRYS ER: 20 | 30 days supply | Qty: 270 | Fill #1

## 2020-02-13 MED FILL — aMILoride HCL 5 MG TABS: 5 | 30 days supply | Qty: 30 | Fill #5

## 2020-02-29 MED FILL — POTASSIUM CHLORIDE CRYS ER: 20 | 30 days supply | Qty: 270 | Fill #2

## 2020-04-04 MED FILL — POTASSIUM CHLORIDE CRYS ER: 20 | 30 days supply | Qty: 270 | Fill #3

## 2020-05-07 MED FILL — POTASSIUM CHLORIDE CRYS ER: 20 | 30 days supply | Qty: 270 | Fill #4

## 2020-05-17 ENCOUNTER — Telehealth: Payer: Self-pay | Admitting: Family Medicine

## 2020-05-17 NOTE — Telephone Encounter (Signed)
error 

## 2020-05-18 ENCOUNTER — Encounter: Payer: Self-pay | Admitting: Family Medicine

## 2020-05-18 ENCOUNTER — Telehealth (INDEPENDENT_AMBULATORY_CARE_PROVIDER_SITE_OTHER): Payer: 59 | Admitting: Family Medicine

## 2020-05-18 ENCOUNTER — Other Ambulatory Visit: Payer: Self-pay

## 2020-05-18 DIAGNOSIS — Z20822 Contact with and (suspected) exposure to covid-19: Secondary | ICD-10-CM | POA: Diagnosis not present

## 2020-05-18 NOTE — Progress Notes (Signed)
I have discussed the procedure for the virtual visit with the patient who has given consent to proceed with assessment and treatment.   Jodell Cipro, CMA

## 2020-05-18 NOTE — Progress Notes (Signed)
Virtual Visit via Video Note  Subjective  CC:  Chief Complaint  Patient presents with  . COVID symptoms    tested yesterday with [facility] - still waiting for results, symptoms started 05/10/20, not vaccinated      I connected with [name] on 05/18/20 at  9:40 AM EDT by a video enabled telemedicine application and verified that I am speaking with the correct person using two identifiers. Location patient: Home Location provider: [facility] [place], Office Persons participating in the virtual visit: Wendie Simmer, MD Reymundo Poll CMA  I discussed the limitations of evaluation and management by telemedicine and the availability of in person appointments. The patient expressed understanding and agreed to proceed. HPI: David Martin is a 44 y.o. male who was contacted today to address the problems listed above in the chief complaint. . 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  It is nonproductive and he denies shortness of breath.  Advil significantly helps his symptoms.  He has no known exposures to COVID but he does go to work.  He is not vaccinated against Covid.  He has been out of work for the last week.  He denies abdominal pain, chest pain or high fever.  He has no skin changes.   Assessment  1. Suspected COVID-19 virus infection      Plan   Pt is 9 days into symptoms. Does not have confirmation yet so antibody infusion unlikely; supportive care recommended. Advil, cough meds, rest and oral hydration. He will let me know his results. Discussed vaccination after recovered, likely quarantine and follow-up if symptoms worsen including respiratory distress.   I discussed the assessment and treatment plan with the patient. The  patient was provided an opportunity to ask questions and all were answered. The patient agreed with the plan and demonstrated an understanding of the instructions.   The patient was advised to call back or seek an in-person evaluation if the symptoms worsen or if the condition fails to improve as anticipated. Follow up: No follow-ups on file.  Visit date not found  No orders of the defined types were placed in this encounter.     I reviewed the patients updated PMH, FH, and SocHx.    Patient Active Problem List   Diagnosis Date Noted  . Cholelithiases 12/21/2017  . History of panic attacks 12/21/2017  . Conductive hearing loss 10/04/2017  . 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 Refusal of statin medication by patient 01/06/2016  . IRRITABLE BOWEL SYNDROME 04/23/2008   Current Meds  Medication Sig  . ALPRAZolam (XANAX) 0.5 MG tablet Take 1 tablet (0.5 mg total) by mouth daily as needed for anxiety.  Marland Kitchen aMILoride (MIDAMOR) 5 MG tablet Take 5 mg by mouth daily.  . fluticasone (FLONASE) 50 MCG/ACT nasal spray Place 2 sprays into both nostrils daily.  . magnesium oxide (MAG-OX) 400 MG tablet Take 4 tablet by mouth 3 times daily  . potassium chloride SA (KLOR-CON M20) 20 MEQ tablet Take 60 mEq by mouth 3 (three) times daily. Take 2 tablet by mouth four times daily    Allergies: Patient has No Known Allergies. Family History: Patient family history includes Dementia in his maternal aunt, maternal uncle, and mother; Diabetes in his paternal grandfather and  paternal grandmother; Hyperlipidemia in his paternal grandfather and paternal grandmother; Hypertension in his father. Social History:  Patient  reports that he has never smoked. He has never used smokeless tobacco. He reports that he does not drink alcohol and does not use drugs.  Review of Systems: Constitutional: Negative for fever malaise or anorexia Cardiovascular: negative for chest pain Respiratory: negative for SOB or  persistent cough Gastrointestinal: negative for abdominal pain  OBJECTIVE Vitals: There were no vitals taken for this visit. General: no acute respiratory distress but he is having a hacking cough, A&Ox3 Clear speech, nasal congestion present No increased work of breathing  [name], MD

## 2020-05-19 ENCOUNTER — Ambulatory Visit (HOSPITAL_COMMUNITY)
Admission: RE | Admit: 2020-05-19 | Discharge: 2020-05-19 | Disposition: A | Discharge status: Home / Self Care | Payer: 59 | Source: Ambulatory Visit | Attending: Pulmonary Disease | Admitting: Pulmonary Disease

## 2020-05-19 ENCOUNTER — Telehealth: Payer: Self-pay | Admitting: Nurse Practitioner

## 2020-05-19 ENCOUNTER — Other Ambulatory Visit: Payer: Self-pay

## 2020-05-19 ENCOUNTER — Encounter (HOSPITAL_COMMUNITY): Payer: Self-pay

## 2020-05-19 ENCOUNTER — Other Ambulatory Visit: Payer: Self-pay | Admitting: Nurse Practitioner

## 2020-05-19 ENCOUNTER — Emergency Department (HOSPITAL_COMMUNITY): Payer: 59

## 2020-05-19 ENCOUNTER — Inpatient Hospital Stay (HOSPITAL_COMMUNITY)
Admission: EM | Admit: 2020-05-19 | Discharge: 2020-05-27 | DRG: 177 | Disposition: A | Discharge status: Home Health | Payer: 59 | Source: Ambulatory Visit | Attending: Internal Medicine | Admitting: Internal Medicine

## 2020-05-19 DIAGNOSIS — K580 Irritable bowel syndrome with diarrhea: Secondary | ICD-10-CM | POA: Diagnosis present

## 2020-05-19 DIAGNOSIS — J1282 Pneumonia due to coronavirus disease 2019: Secondary | ICD-10-CM | POA: Diagnosis not present

## 2020-05-19 DIAGNOSIS — E2681 Bartter's syndrome: Secondary | ICD-10-CM | POA: Diagnosis present

## 2020-05-19 DIAGNOSIS — U071 COVID-19: Secondary | ICD-10-CM | POA: Diagnosis not present

## 2020-05-19 DIAGNOSIS — J9600 Acute respiratory failure, unspecified whether with hypoxia or hypercapnia: Secondary | ICD-10-CM | POA: Diagnosis present

## 2020-05-19 DIAGNOSIS — Z83438 Family history of other disorder of lipoprotein metabolism and other lipidemia: Secondary | ICD-10-CM | POA: Diagnosis not present

## 2020-05-19 DIAGNOSIS — Z833 Family history of diabetes mellitus: Secondary | ICD-10-CM

## 2020-05-19 DIAGNOSIS — I129 Hypertensive chronic kidney disease with stage 1 through stage 4 chronic kidney disease, or unspecified chronic kidney disease: Secondary | ICD-10-CM | POA: Diagnosis present

## 2020-05-19 DIAGNOSIS — Z8659 Personal history of other mental and behavioral disorders: Secondary | ICD-10-CM | POA: Diagnosis not present

## 2020-05-19 DIAGNOSIS — R0902 Hypoxemia: Secondary | ICD-10-CM

## 2020-05-19 DIAGNOSIS — R0603 Acute respiratory distress: Secondary | ICD-10-CM | POA: Diagnosis present

## 2020-05-19 DIAGNOSIS — Z8249 Family history of ischemic heart disease and other diseases of the circulatory system: Secondary | ICD-10-CM

## 2020-05-19 DIAGNOSIS — I2699 Other pulmonary embolism without acute cor pulmonale: Secondary | ICD-10-CM | POA: Diagnosis not present

## 2020-05-19 DIAGNOSIS — I10 Essential (primary) hypertension: Secondary | ICD-10-CM

## 2020-05-19 DIAGNOSIS — F419 Anxiety disorder, unspecified: Secondary | ICD-10-CM | POA: Diagnosis present

## 2020-05-19 DIAGNOSIS — N179 Acute kidney failure, unspecified: Secondary | ICD-10-CM | POA: Diagnosis present

## 2020-05-19 DIAGNOSIS — N182 Chronic kidney disease, stage 2 (mild): Secondary | ICD-10-CM | POA: Diagnosis present

## 2020-05-19 DIAGNOSIS — R7989 Other specified abnormal findings of blood chemistry: Secondary | ICD-10-CM | POA: Diagnosis not present

## 2020-05-19 DIAGNOSIS — J188 Other pneumonia, unspecified organism: Secondary | ICD-10-CM | POA: Diagnosis not present

## 2020-05-19 DIAGNOSIS — R918 Other nonspecific abnormal finding of lung field: Secondary | ICD-10-CM | POA: Diagnosis not present

## 2020-05-19 DIAGNOSIS — E785 Hyperlipidemia, unspecified: Secondary | ICD-10-CM | POA: Diagnosis not present

## 2020-05-19 DIAGNOSIS — J9601 Acute respiratory failure with hypoxia: Secondary | ICD-10-CM | POA: Diagnosis present

## 2020-05-19 LAB — CBC WITH DIFFERENTIAL/PLATELET
Abs Immature Granulocytes: 0.22 10*3/uL — ABNORMAL HIGH (ref 0.00–0.07)
Basophils Absolute: 0 10*3/uL (ref 0.0–0.1)
Basophils Relative: 0 %
Eosinophils Absolute: 0 10*3/uL (ref 0.0–0.5)
Eosinophils Relative: 0 %
HCT: 41.5 % (ref 39.0–52.0)
Hemoglobin: 13.5 g/dL (ref 13.0–17.0)
Immature Granulocytes: 2 %
Lymphocytes Relative: 4 %
Lymphs Abs: 0.6 10*3/uL — ABNORMAL LOW (ref 0.7–4.0)
MCH: 25.6 pg — ABNORMAL LOW (ref 26.0–34.0)
MCHC: 32.5 g/dL (ref 30.0–36.0)
MCV: 78.6 fL — ABNORMAL LOW (ref 80.0–100.0)
Monocytes Absolute: 0.4 10*3/uL (ref 0.1–1.0)
Monocytes Relative: 3 %
Neutro Abs: 12.6 10*3/uL — ABNORMAL HIGH (ref 1.7–7.7)
Neutrophils Relative %: 91 %
Platelets: 296 10*3/uL (ref 150–400)
RBC: 5.28 MIL/uL (ref 4.22–5.81)
RDW: 13.4 % (ref 11.5–15.5)
WBC: 13.8 10*3/uL — ABNORMAL HIGH (ref 4.0–10.5)
nRBC: 0 % (ref 0.0–0.2)

## 2020-05-19 LAB — COMPREHENSIVE METABOLIC PANEL
ALT: 76 U/L — ABNORMAL HIGH (ref 0–44)
AST: 89 U/L — ABNORMAL HIGH (ref 15–41)
Albumin: 3.5 g/dL (ref 3.5–5.0)
Alkaline Phosphatase: 91 U/L (ref 38–126)
Anion gap: 12 (ref 5–15)
BUN: 28 mg/dL — ABNORMAL HIGH (ref 6–20)
CO2: 27 mmol/L (ref 22–32)
Calcium: 8 mg/dL — ABNORMAL LOW (ref 8.9–10.3)
Chloride: 102 mmol/L (ref 98–111)
Creatinine, Ser: 1.67 mg/dL — ABNORMAL HIGH (ref 0.61–1.24)
GFR calc Af Amer: 57 mL/min — ABNORMAL LOW (ref >60)
GFR calc non Af Amer: 49 mL/min — ABNORMAL LOW (ref >60)
Glucose, Bld: 132 mg/dL — ABNORMAL HIGH (ref 70–99)
Potassium: 3.5 mmol/L (ref 3.5–5.1)
Sodium: 141 mmol/L (ref 135–145)
Total Bilirubin: 0.7 mg/dL (ref 0.3–1.2)
Total Protein: 7.5 g/dL (ref 6.5–8.1)

## 2020-05-19 LAB — TRIGLYCERIDES: Triglycerides: 83 mg/dL (ref <150)

## 2020-05-19 LAB — LACTIC ACID, PLASMA
Lactic Acid, Venous: 1.7 mmol/L (ref 0.5–1.9)
Lactic Acid, Venous: 1.2 mmol/L (ref 0.5–1.9)

## 2020-05-19 LAB — D-DIMER, QUANTITATIVE: D-Dimer, Quant: 3.41 ug/mL-FEU — ABNORMAL HIGH (ref 0.00–0.50)

## 2020-05-19 LAB — LACTATE DEHYDROGENASE: LDH: 507 U/L — ABNORMAL HIGH (ref 98–192)

## 2020-05-19 LAB — PROCALCITONIN: Procalcitonin: <0.1 ng/mL

## 2020-05-19 LAB — FIBRINOGEN: Fibrinogen: 726 mg/dL — ABNORMAL HIGH (ref 210–475)

## 2020-05-19 LAB — C-REACTIVE PROTEIN: CRP: 20.8 mg/dL — ABNORMAL HIGH (ref <1.0)

## 2020-05-19 LAB — FERRITIN: Ferritin: 672 ng/mL — ABNORMAL HIGH (ref 24–336)

## 2020-05-19 LAB — PHOSPHORUS: Phosphorus: 3.3 mg/dL (ref 2.5–4.6)

## 2020-05-19 LAB — MRSA PCR SCREENING: MRSA by PCR: NEGATIVE

## 2020-05-19 LAB — MAGNESIUM: Magnesium: 2.8 mg/dL — ABNORMAL HIGH (ref 1.7–2.4)

## 2020-05-19 MED ORDER — FLUTICASONE PROPIONATE 50 MCG/ACT NA SUSP
2.0000 | Freq: Every day | NASAL | Status: DC
  Administered 2020-05-20: 2 via NASAL
  Filled 2020-05-19: qty 16

## 2020-05-19 MED ORDER — EPINEPHRINE 0.3 MG/0.3ML IJ SOAJ: 0.3000 mg | Freq: Once | INTRAMUSCULAR | Status: DC | PRN

## 2020-05-19 MED ORDER — SODIUM CHLORIDE 0.9 % IV SOLN: 1200.0000 mg | Freq: Once | INTRAVENOUS | Status: DC

## 2020-05-19 MED ORDER — ACETAMINOPHEN 325 MG PO TABS: 650.0000 mg | ORAL_TABLET | Freq: Four times a day (QID) | ORAL | Status: DC | PRN

## 2020-05-19 MED ORDER — SODIUM CHLORIDE 0.9 % IV SOLN
100.0000 mg | Freq: Every day | INTRAVENOUS | Status: AC
  Administered 2020-05-20 – 2020-05-23 (×4): 100 mg via INTRAVENOUS
  Filled 2020-05-19 (×4): qty 20

## 2020-05-19 MED ORDER — HYDRALAZINE HCL 20 MG/ML IJ SOLN: 10.0000 mg | Freq: Four times a day (QID) | INTRAMUSCULAR | Status: DC | PRN

## 2020-05-19 MED ORDER — HEPARIN SODIUM (PORCINE) 5000 UNIT/ML IJ SOLN
5000.0000 [IU] | Freq: Three times a day (TID) | INTRAMUSCULAR | Status: DC
  Administered 2020-05-19 – 2020-05-20 (×2): 5000 [IU] via SUBCUTANEOUS
  Filled 2020-05-19 (×2): qty 1

## 2020-05-19 MED ORDER — SODIUM CHLORIDE 0.9 % IV SOLN: INTRAVENOUS | Status: DC | PRN

## 2020-05-19 MED ORDER — ONDANSETRON HCL 4 MG PO TABS: 4.0000 mg | ORAL_TABLET | Freq: Four times a day (QID) | ORAL | Status: DC | PRN

## 2020-05-19 MED ORDER — ASCORBIC ACID 500 MG PO TABS
500.0000 mg | ORAL_TABLET | Freq: Every day | ORAL | Status: DC
  Administered 2020-05-20 – 2020-05-27 (×8): 500 mg via ORAL
  Filled 2020-05-19 (×8): qty 1

## 2020-05-19 MED ORDER — POTASSIUM CHLORIDE CRYS ER 20 MEQ PO TBCR
60.0000 meq | EXTENDED_RELEASE_TABLET | Freq: Three times a day (TID) | ORAL | Status: DC
  Administered 2020-05-19 – 2020-05-22 (×8): 60 meq via ORAL
  Filled 2020-05-19 (×8): qty 3

## 2020-05-19 MED ORDER — ALPRAZOLAM 0.5 MG PO TABS: 0.5000 mg | ORAL_TABLET | Freq: Every day | ORAL | Status: DC | PRN

## 2020-05-19 MED ORDER — SORBITOL 70 % SOLN
30.0000 mL | Freq: Every day | Status: DC | PRN
  Filled 2020-05-19: qty 30

## 2020-05-19 MED ORDER — IPRATROPIUM-ALBUTEROL 20-100 MCG/ACT IN AERS
1.0000 | INHALATION_SPRAY | Freq: Four times a day (QID) | RESPIRATORY_TRACT | Status: DC
  Administered 2020-05-20 (×4): 1 via RESPIRATORY_TRACT
  Filled 2020-05-19 (×2): qty 4

## 2020-05-19 MED ORDER — TRAMADOL HCL 50 MG PO TABS: 50.0000 mg | ORAL_TABLET | Freq: Four times a day (QID) | ORAL | Status: DC | PRN

## 2020-05-19 MED ORDER — MAGNESIUM CITRATE PO SOLN: 1.0000 | Freq: Once | ORAL | Status: DC | PRN

## 2020-05-19 MED ORDER — POTASSIUM CHLORIDE CRYS ER 20 MEQ PO TBCR
40.0000 meq | EXTENDED_RELEASE_TABLET | Freq: Once | ORAL | Status: AC
  Administered 2020-05-19: 40 meq via ORAL
  Filled 2020-05-19: qty 2

## 2020-05-19 MED ORDER — CHLORHEXIDINE GLUCONATE CLOTH 2 % EX PADS
6.0000 | MEDICATED_PAD | Freq: Every day | CUTANEOUS | Status: DC
  Administered 2020-05-19 – 2020-05-20 (×2): 6 via TOPICAL

## 2020-05-19 MED ORDER — HYDROCOD POLST-CPM POLST ER 10-8 MG/5ML PO SUER: 5.0000 mL | Freq: Two times a day (BID) | ORAL | Status: DC | PRN

## 2020-05-19 MED ORDER — GUAIFENESIN-DM 100-10 MG/5ML PO SYRP: 10.0000 mL | ORAL_SOLUTION | ORAL | Status: DC | PRN

## 2020-05-19 MED ORDER — METHYLPREDNISOLONE SODIUM SUCC 125 MG IJ SOLR
60.0000 mg | Freq: Two times a day (BID) | INTRAMUSCULAR | Status: DC
  Administered 2020-05-20 – 2020-05-23 (×7): 60 mg via INTRAVENOUS
  Filled 2020-05-19 (×6): qty 2

## 2020-05-19 MED ORDER — FAMOTIDINE IN NACL 20-0.9 MG/50ML-% IV SOLN: 20.0000 mg | Freq: Once | INTRAVENOUS | Status: DC | PRN

## 2020-05-19 MED ORDER — ONDANSETRON HCL 4 MG/2ML IJ SOLN: 4.0000 mg | Freq: Four times a day (QID) | INTRAMUSCULAR | Status: DC | PRN

## 2020-05-19 MED ORDER — PANTOPRAZOLE SODIUM 40 MG PO TBEC
40.0000 mg | DELAYED_RELEASE_TABLET | Freq: Every day | ORAL | Status: DC
  Administered 2020-05-20 – 2020-05-27 (×9): 40 mg via ORAL
  Filled 2020-05-19 (×8): qty 1

## 2020-05-19 MED ORDER — BARICITINIB 2 MG PO TABS
2.0000 mg | ORAL_TABLET | Freq: Every day | ORAL | Status: DC
  Administered 2020-05-20: 2 mg via ORAL
  Filled 2020-05-19: qty 1

## 2020-05-19 MED ORDER — DIPHENHYDRAMINE HCL 50 MG/ML IJ SOLN: 50.0000 mg | Freq: Once | INTRAMUSCULAR | Status: DC | PRN

## 2020-05-19 MED ORDER — ALBUTEROL SULFATE HFA 108 (90 BASE) MCG/ACT IN AERS: 2.0000 | INHALATION_SPRAY | Freq: Once | RESPIRATORY_TRACT | Status: DC | PRN

## 2020-05-19 MED ORDER — MAGNESIUM CHLORIDE 64 MG PO TBEC
4.0000 | DELAYED_RELEASE_TABLET | Freq: Three times a day (TID) | ORAL | Status: DC
  Administered 2020-05-19 – 2020-05-27 (×23): 256 mg via ORAL
  Filled 2020-05-19 (×30): qty 4

## 2020-05-19 MED ORDER — SODIUM CHLORIDE 0.9 % IV SOLN: INTRAVENOUS | Status: DC

## 2020-05-19 MED ORDER — SODIUM CHLORIDE 0.9 % IV SOLN
200.0000 mg | Freq: Once | INTRAVENOUS | Status: AC
  Administered 2020-05-19: 200 mg via INTRAVENOUS
  Filled 2020-05-19: qty 200

## 2020-05-19 MED ORDER — SODIUM CHLORIDE 0.9% FLUSH
3.0000 mL | Freq: Two times a day (BID) | INTRAVENOUS | Status: DC
  Administered 2020-05-19 – 2020-05-26 (×14): 3 mL via INTRAVENOUS

## 2020-05-19 MED ORDER — OMEGA-3-ACID ETHYL ESTERS 1 G PO CAPS
1.0000 | ORAL_CAPSULE | Freq: Two times a day (BID) | ORAL | Status: DC
  Administered 2020-05-19 – 2020-05-27 (×16): 1 g via ORAL
  Filled 2020-05-19 (×17): qty 1

## 2020-05-19 MED ORDER — SENNOSIDES-DOCUSATE SODIUM 8.6-50 MG PO TABS
1.0000 | ORAL_TABLET | Freq: Every evening | ORAL | Status: DC | PRN
  Filled 2020-05-19: qty 1

## 2020-05-19 MED ORDER — ZINC SULFATE 220 (50 ZN) MG PO CAPS
220.0000 mg | ORAL_CAPSULE | Freq: Every day | ORAL | Status: DC
  Administered 2020-05-20 – 2020-05-27 (×8): 220 mg via ORAL
  Filled 2020-05-19 (×8): qty 1

## 2020-05-19 MED ORDER — DEXAMETHASONE SODIUM PHOSPHATE 10 MG/ML IJ SOLN
10.0000 mg | Freq: Once | INTRAMUSCULAR | Status: AC
  Administered 2020-05-19: 10 mg via INTRAVENOUS
  Filled 2020-05-19: qty 1

## 2020-05-19 MED ORDER — METHYLPREDNISOLONE SODIUM SUCC 125 MG IJ SOLR: 125.0000 mg | Freq: Once | INTRAMUSCULAR | Status: DC | PRN

## 2020-05-19 MED ORDER — AMILORIDE HCL 5 MG PO TABS
5.0000 mg | ORAL_TABLET | Freq: Every day | ORAL | Status: DC
  Administered 2020-05-19: 5 mg via ORAL
  Filled 2020-05-19 (×2): qty 1

## 2020-05-19 MED ORDER — LORATADINE 10 MG PO TABS
10.0000 mg | ORAL_TABLET | Freq: Every day | ORAL | Status: DC
  Administered 2020-05-20 – 2020-05-25 (×6): 10 mg via ORAL
  Filled 2020-05-19 (×7): qty 1

## 2020-05-19 NOTE — Progress Notes (Addendum)
Pt was transported to ER (Room4) due to O2 level 79-83 on room air.  Pt was seen by NP and she suggested he go to ER to be seenn by provider.   Report was given to charge nurse and nurse for room 4.

## 2020-05-19 NOTE — ED Triage Notes (Signed)
Magda Paganini, RN brought patient from the [facility] for hypoxia. Pt was 79%-82% O2 on RA. Pt up to 4L at 92%. Pt 93% on 5L upon arrival to room. Pt denies SOB, denies pain.

## 2020-05-19 NOTE — Progress Notes (Signed)
I connected by phone with David Martin on 05/19/2020 at 12:55 PM to discuss the potential use of a new treatment for mild to moderate COVID-19 viral infection in non-hospitalized patients.  This patient is a 44 y.o. male that meets the FDA criteria for Emergency Use Authorization of COVID monoclonal antibody casirivimab/imdevimab.  Has a (+) direct SARS-CoV-2 viral test result  Has mild or moderate COVID-19   Is NOT hospitalized due to COVID-19  Is within 10 days of symptom onset  Has at least one of the high risk factor(s) for progression to severe COVID-19 and/or hospitalization as defined in EUA.  Specific high risk criteria : Cardiovascular disease or hypertension   I have spoken and communicated the following to the patient or parent/caregiver regarding COVID monoclonal antibody treatment:  1. FDA has authorized the emergency use for the treatment of mild to moderate COVID-19 in adults and pediatric patients with positive results of direct SARS-CoV-2 viral testing who are 80 years of age and older weighing at least 40 kg, and who are at high risk for progressing to severe COVID-19 and/or hospitalization.  2. The significant known and potential risks and benefits of COVID monoclonal antibody, and the extent to which such potential risks and benefits are unknown.  3. Information on available alternative treatments and the risks and benefits of those alternatives, including clinical trials.  4. Patients treated with COVID monoclonal antibody should continue to self-isolate and use infection control measures (e.g., wear mask, isolate, social distance, avoid sharing personal items, clean and disinfect "high touch" surfaces, and frequent handwashing) according to CDC guidelines.   5. The patient or parent/caregiver has the option to accept or refuse COVID monoclonal antibody treatment.  After reviewing this information with the patient, The patient agreed to proceed with receiving  casirivimab\imdevimab infusion and will be provided a copy of the Fact sheet prior to receiving the infusion.  Murray Hodgkins, NP 05/19/2020 12:55 PM

## 2020-05-19 NOTE — ED Provider Notes (Signed)
[facility] DEPT Provider Note   CSN: [id number] Arrival date & time: 05/19/20  1453     History Chief Complaint  Patient presents with  . Covid Positive  . Shortness of Breath    David Martin is a 44 y.o. 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  Patient was recently diagnosed with COVID-19, was at the infusion center today to get the monoclonal antibody, they noticed that his O2 sat was 79%.  Temperature was 100.7 when he arrived to the infusion clinic today.  Patient states that he feels fine, does not feel short of breath.  States that he has some slight myalgias, other than that states that his symptoms have improved over the 9 days.  Has been taking Advil which has been helping him.  Denies any nausea, vomiting, abdominal pain, headache, vision changes, chest pain, shortness of breath, congestion, cough, sore throat, chest pain, numbness, tingling, leg swelling.  Did not get vaccinated against Covid.  States that he is generally healthy.        HPI     Past Medical History:  Diagnosis Date  . Anxiety   . Cholelithiases 12/21/2017   By xray; asymptomatic. Owens Loffler, MD GI [phone]  . Hyperlipidemia   . Hypertension   . Hypokalemia   . IBS (irritable bowel syndrome)     Patient Active Problem List   Diagnosis Date Noted  . Acute respiratory failure due to COVID-19 ([place]) 05/19/2020  . Cholelithiases 12/21/2017  . History of panic attacks 12/21/2017  . Conductive hearing loss 10/04/2017  . 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 Refusal of statin medication by patient 01/06/2016  . IRRITABLE BOWEL SYNDROME 04/23/2008    History reviewed. No pertinent surgical history.     Family History  Problem Relation Age of Onset  . Dementia Mother   . Hypertension Father   . Diabetes Paternal Grandmother   . Hyperlipidemia Paternal  Grandmother   . Diabetes Paternal Grandfather   . Hyperlipidemia Paternal Grandfather   . Dementia Maternal Aunt   . Dementia Maternal Uncle     Social History   Tobacco Use  . Smoking status: Never Smoker  . Smokeless tobacco: Never Used  Vaping Use  . Vaping Use: Never used  Substance Use Topics  . Alcohol use: No  . Drug use: No    Home Medications Prior to Admission medications   Medication Sig Start Date End Date Taking? Authorizing Provider  ALPRAZolam Duanne Moron) 0.5 MG tablet Take 1 tablet (0.5 mg total) by mouth daily as needed for anxiety. 12/21/17  Yes [name], MD  aMILoride (MIDAMOR) 5 MG tablet Take 5 mg by mouth daily.   Yes [provider]  ibuprofen (ADVIL) 200 MG tablet Take 400 mg by mouth every 6 (six) hours as needed (pain).   Yes [provider]  Magnesium Cl-Calcium Carbonate (SLOW-MAG PO) Take 4 tablets by mouth 3 (three) times daily.   Yes [provider]  potassium chloride SA (KLOR-CON M20) 20 MEQ tablet Take 60 mEq by mouth 3 (three) times daily.    Yes [provider]  Omega-3 Fatty Acids (FISH OIL TRIPLE STRENGTH PO) Take 1 capsule by mouth 2 (two) times daily.    [provider]    Allergies    Patient has no known allergies.  Review of Systems   Review of Systems  Constitutional: Negative for chills,  diaphoresis, fatigue and fever.  HENT: Negative for congestion, sore throat and trouble swallowing.   Eyes: Negative for pain and visual disturbance.  Respiratory: Positive for shortness of breath. Negative for cough and wheezing.   Cardiovascular: Negative for chest pain, palpitations and leg swelling.  Gastrointestinal: Negative for abdominal distention, abdominal pain, diarrhea, nausea and vomiting.  Genitourinary: Negative for difficulty urinating.  Musculoskeletal: Negative for back pain, neck pain and neck stiffness.  Skin: Negative for pallor.  Neurological: Negative for dizziness, speech  difficulty, weakness and headaches.  Psychiatric/Behavioral: Negative for confusion.    Physical Exam Updated Vital Signs BP 121/72   Pulse 90   Temp 98.6 F (37 C) (Oral)   Resp 20   SpO2 92%   Physical Exam Constitutional:      General: He is not in acute distress.    Appearance: Normal appearance. He is not ill-appearing, toxic-appearing or diaphoretic.     Comments: Patient appears well, is able to speak to me in full sentences, however is satting at 91% on 6 L, tachypneic to mid 30s.  States that he does not feel short of breath, says he feels fine.  HENT:     Mouth/Throat:     Mouth: Mucous membranes are moist.     Pharynx: Oropharynx is clear.  Eyes:     General: No scleral icterus.    Extraocular Movements: Extraocular movements intact.     Pupils: Pupils are equal, round, and reactive to light.  Cardiovascular:     Rate and Rhythm: Normal rate and regular rhythm.     Pulses: Normal pulses.     Heart sounds: Normal heart sounds.  Pulmonary:     Effort: Tachypnea and respiratory distress present.     Breath sounds: No stridor. Decreased breath sounds and rhonchi present. No wheezing or rales.  Chest:     Chest wall: No tenderness.  Abdominal:     General: Abdomen is flat. There is no distension.     Palpations: Abdomen is soft.     Tenderness: There is no abdominal tenderness. There is no guarding or rebound.  Musculoskeletal:        General: No swelling or tenderness. Normal range of motion.     Cervical back: Normal range of motion and neck supple. No rigidity.     Right lower leg: No edema.     Left lower leg: No edema.  Skin:    General: Skin is warm and dry.     Capillary Refill: Capillary refill takes less than 2 seconds.     Coloration: Skin is not pale.  Neurological:     General: No focal deficit present.     Mental Status: He is alert and oriented to person, place, and time.  Psychiatric:        Mood and Affect: Mood normal.        Behavior:  Behavior normal.     ED Results / Procedures / Treatments   Labs (all labs ordered are listed, but only abnormal results are displayed) Labs Reviewed  CBC WITH DIFFERENTIAL/PLATELET - Abnormal; Notable for the following components:      Result Value   WBC 13.8 (*)    MCV 78.6 (*)    MCH 25.6 (*)    Neutro Abs 12.6 (*)    Lymphs Abs 0.6 (*)    Abs Immature Granulocytes 0.22 (*)    All other components within normal limits  COMPREHENSIVE METABOLIC PANEL - Abnormal; Notable for the following  components:   Glucose, Bld 132 (*)    BUN 28 (*)    Creatinine, Ser 1.67 (*)    Calcium 8.0 (*)    AST 89 (*)    ALT 76 (*)    GFR calc non Af Amer 49 (*)    GFR calc Af Amer 57 (*)    All other components within normal limits  D-DIMER, QUANTITATIVE (NOT AT [facility]) - Abnormal; Notable for the following components:   D-Dimer, Quant 3.41 (*)    All other components within normal limits  LACTATE DEHYDROGENASE - Abnormal; Notable for the following components:   LDH 507 (*)    All other components within normal limits  FERRITIN - Abnormal; Notable for the following components:   Ferritin 672 (*)    All other components within normal limits  FIBRINOGEN - Abnormal; Notable for the following components:   Fibrinogen 726 (*)    All other components within normal limits  C-REACTIVE PROTEIN - Abnormal; Notable for the following components:   CRP 20.8 (*)    All other components within normal limits  MAGNESIUM - Abnormal; Notable for the following components:   Magnesium 2.8 (*)    All other components within normal limits  CULTURE, BLOOD (ROUTINE X 2)  CULTURE, BLOOD (ROUTINE X 2)  LACTIC ACID, PLASMA  PROCALCITONIN  TRIGLYCERIDES  PHOSPHORUS  LACTIC ACID, PLASMA    EKG EKG Interpretation  Date/Time:  Sunday May 19 2020 15:10:04 EDT Ventricular Rate:  86 PR Interval:    QRS Duration: 94 QT Interval:  361 QTC Calculation: 432 R Axis:   17 Text Interpretation: Sinus rhythm No  previous ECGs available Confirmed by Wandra Arthurs [id number]) on 05/19/2020 3:19:15 PM   Radiology DG Chest Port 1 View  Result Date: 05/19/2020 CLINICAL DATA:  Shortness of breath, COVID, hypoxia EXAM: PORTABLE CHEST 1 VIEW COMPARISON:  03/29/2014 FINDINGS: The heart size and mediastinal contours are within normal limits. Subtle bilateral heterogeneous airspace opacity. The visualized skeletal structures are unremarkable. IMPRESSION: Subtle bilateral heterogeneous airspace opacity, generally in keeping with reported COVID infection. Electronically Signed   By: Eddie Candle M.D.   On: 05/19/2020 15:58    Procedures Procedures (including critical care time)  Medications Ordered in ED Medications  remdesivir 200 mg in sodium chloride 0.9% 250 mL IVPB (200 mg Intravenous New Bag/Given 05/19/20 1659)    Followed by  remdesivir 100 mg in sodium chloride 0.9 % 100 mL IVPB (has no administration in time range)  methylPREDNISolone sodium succinate (SOLU-MEDROL) 125 mg/2 mL injection 60 mg (has no administration in time range)  potassium chloride SA (KLOR-CON) CR tablet 40 mEq (has no administration in time range)  0.9 %  sodium chloride infusion (has no administration in time range)  dexamethasone (DECADRON) injection 10 mg (10 mg Intravenous Given 05/19/20 1539)    ED Course  I have reviewed the triage vital signs and the nursing notes.  Pertinent labs & imaging results that were available during my care of the patient were reviewed by me and considered in my medical decision making (see chart for details).    MDM Rules/Calculators/A&P                         David Martin is a 44 y.o. 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  Patient is Covid positive, chest x-ray with Covid pneumonia.  Patient is  stable, normal blood pressure. Patient to be admitted for hypoxia, is currently on 6 L  satting at 92%.  If patient requires more oxygen will change to high flow, patient appears well currently.  Is sitting up and speaking to me in full sentences, states that he does not feel short of breath.  Respirations in the 20-30s.   445 upon reassessment hour later patient is still satting at 92% on 6 L, still not saying he is short of breath.  Will consult hospitalist at this time for [place].   510 spoke to Dr. Grandville Silos, hospitalist who accepts patient.  The patient appears reasonably stabilized for admission considering the current resources, flow, and capabilities available in the ED at this time, and I doubt any other [facility] requiring further screening and/or treatment in the ED prior to admission.  I discussed this case with my attending physician who cosigned this note including patient's presenting symptoms, physical exam, and planned diagnostics and interventions. Attending physician stated agreement with plan or made changes to plan which were implemented.   Final Clinical Impression(s) / ED Diagnoses Final diagnoses:  Hypoxia  Respiratory distress  Pneumonia due to COVID-19 virus    Rx / DC Orders ED Discharge Orders    None       Alfredia Client, PA-C 05/19/20 1748    Drenda Freeze, MD 05/23/20 [phone]

## 2020-05-19 NOTE — Telephone Encounter (Signed)
I connected by phone with David Martin on 05/19/2020 at 12:41 PM to discuss the potential use of a new treatment for mild to moderate COVID-19 viral infection in non-hospitalized patients.  This patient is a 44 y.o. male that meets the FDA criteria for Emergency Use Authorization of COVID monoclonal antibody casirivimab/imdevimab.  Has a (+) direct SARS-CoV-2 viral test result  Has mild or moderate COVID-19   Is NOT hospitalized due to COVID-19  Is within 10 days of symptom onset  Has at least one of the high risk factor(s) for progression to severe COVID-19 and/or hospitalization as defined in EUA.  Specific high risk criteria : Cardiovascular disease or hypertension   I have spoken and communicated the following to the patient or parent/caregiver regarding COVID monoclonal antibody treatment:  1. FDA has authorized the emergency use for the treatment of mild to moderate COVID-19 in adults and pediatric patients with positive results of direct SARS-CoV-2 viral testing who are 53 years of age and older weighing at least 40 kg, and who are at high risk for progressing to severe COVID-19 and/or hospitalization.  2. The significant known and potential risks and benefits of COVID monoclonal antibody, and the extent to which such potential risks and benefits are unknown.  3. Information on available alternative treatments and the risks and benefits of those alternatives, including clinical trials.  4. Patients treated with COVID monoclonal antibody should continue to self-isolate and use infection control measures (e.g., wear mask, isolate, social distance, avoid sharing personal items, clean and disinfect "high touch" surfaces, and frequent handwashing) according to CDC guidelines.   5. The patient or parent/caregiver has the option to accept or refuse COVID monoclonal antibody treatment.  After reviewing this information with the patient, The patient agreed to proceed with receiving  casirivimab\imdevimab infusion and will be provided a copy of the Fact sheet prior to receiving the infusion.  Murray Hodgkins, NP 05/19/2020 12:41 PM

## 2020-05-19 NOTE — Plan of Care (Signed)
  Problem: Education: Goal: Knowledge of General Education information will improve Description: Including pain rating scale, medication(s)/side effects and non-pharmacologic comfort measures Outcome: Progressing   Problem: Health Behavior/Discharge Planning: Goal: Ability to manage health-related needs will improve Outcome: Progressing   Problem: Nutrition: Goal: Adequate nutrition will be maintained Outcome: Progressing   Problem: Elimination: Goal: Will not experience complications related to bowel motility Outcome: Progressing Goal: Will not experience complications related to urinary retention Outcome: Progressing   Problem: Pain Managment: Goal: General experience of comfort will improve Outcome: Progressing   

## 2020-05-19 NOTE — Discharge Instructions (Signed)
Patient sent to the ER

## 2020-05-19 NOTE — H&P (Signed)
History and Physical    David Martin VQM:[id number] DOB: [birth year] DOA: 05/19/2020  PCP: [name], MD  Patient coming from: Home  I have personally briefly reviewed patient's old medical records in [place]  Chief Complaint: Hypoxia  HPI: David Martin is a 44 y.o. male with medical history significant of anxiety, hyperlipidemia, Bartter's syndrome,?  Hypertension (patient denies states he has whitecoat hypertension) who recently diagnosed with COVID-19 on 05/17/2020, had presented to Covid infusion clinic to receive monoclonal antibody however noted to have hypoxia with sats of 79 to 82% on room air requiring 4 L for sats of 92% and on presentation to the ED up to 6 L with sats of 92%.  Patient noted to be unvaccinated and states he was getting ready to get the vaccine.  Patient stated onset of symptoms 05/10/2020 with generalized fatigue, metallic smell and taste, some intermittent diarrhea, loss of appetite, dizziness (onset 3 days prior to admission).  Occasional intermittent cough.  Patient denies any fevers, no chills, no nausea, no vomiting, no abdominal pain, no constipation, no melena, no hematemesis, no hematochezia, no syncope, no chest pain, no significant shortness of breath.   ED Course: Patient seen in the ED noted to have sats requiring 6 L nasal cannula with sats up to 91 to 92%.  Comprehensive metabolic profile with a BUN of 28, creatinine of 1.67, calcium of 8.0, AST of 89, ALT of 76.  LDH elevated at 507.  Ferritin elevated at 672.  CRP of 20.8.  Procalcitonin negative.  Lactic acid at 1.7.  CBC with a white count of 13.8 otherwise was within normal limits.  D-dimer of 3.41.  Fibrinogen of 726.  Chest x-ray with subtle bilateral heterogeneous airspace opacity generally in keeping with reported Covid infection.  Patient given a dose of IV Decadron, IV remdesivir ordered per ED physician.  Hospitalist were called to admit the patient for further evaluation and  management.      Review of Systems: As per HPI otherwise all other systems reviewed and are negative.  Past Medical History:  Diagnosis Date  . Anxiety   . Cholelithiases 12/21/2017   By xray; asymptomatic. Owens Loffler, MD GI [phone]  . Hyperlipidemia   . Hypertension   . Hypokalemia   . IBS (irritable bowel syndrome)     History reviewed. No pertinent surgical history.  Social History  reports that he has never smoked. He has never used smokeless tobacco. He reports that he does not drink alcohol and does not use drugs.  No Known Allergies  Family History  Problem Relation Age of Onset  . Dementia Mother   . Hypertension Father   . Diabetes Paternal Grandmother   . Hyperlipidemia Paternal Grandmother   . Diabetes Paternal Grandfather   . Hyperlipidemia Paternal Grandfather   . Dementia Maternal Aunt   . Dementia Maternal Uncle    Mother alive age 42 with dementia. Father alive in his seventies with hypertension.  Prior to Admission medications   Medication Sig Start Date End Date Taking? Authorizing Provider  ALPRAZolam Duanne Moron) 0.5 MG tablet Take 1 tablet (0.5 mg total) by mouth daily as needed for anxiety. 12/21/17  Yes [name], MD  aMILoride (MIDAMOR) 5 MG tablet Take 5 mg by mouth daily.   Yes [provider]  ibuprofen (ADVIL) 200 MG tablet Take 400 mg by mouth every 6 (six) hours as needed (pain).   Yes [provider]  Magnesium Cl-Calcium Carbonate (SLOW-MAG PO)  Take 4 tablets by mouth 3 (three) times daily.   Yes [provider]  potassium chloride SA (KLOR-CON M20) 20 MEQ tablet Take 60 mEq by mouth 3 (three) times daily.    Yes [provider]  Omega-3 Fatty Acids (FISH OIL TRIPLE STRENGTH PO) Take 1 capsule by mouth 2 (two) times daily.    [provider]    Physical Exam: Vitals:   05/19/20 1502 05/19/20 1504 05/19/20 1724 05/19/20 1814  BP: 120/79 117/71 121/72 119/69  Pulse: 86 98 90 88  Resp: (!)  25 20 20  (!) 22  Temp: 98.9 F (37.2 C) 98.6 F (37 C)    TempSrc: Oral Oral    SpO2: 92% 91% 92% 90%    Constitutional: NAD, calm, comfortable Vitals:   05/19/20 1502 05/19/20 1504 05/19/20 1724 05/19/20 1814  BP: 120/79 117/71 121/72 119/69  Pulse: 86 98 90 88  Resp: (!) 25 20 20  (!) 22  Temp: 98.9 F (37.2 C) 98.6 F (37 C)    TempSrc: Oral Oral    SpO2: 92% 91% 92% 90%   Eyes: PERRL, lids and conjunctivae normal ENMT: Mucous membranes are moist. Posterior pharynx clear of any exudate or lesions.Normal dentition.  Neck: normal, supple, no masses, no thyromegaly Respiratory: Coarse breath sounds in the bases. No wheezing. No crackles. Normal respiratory effort. Cardiovascular: Regular rate and rhythm, no murmurs / rubs / gallops. No extremity edema. 2+ pedal pulses. No carotid bruits.  Abdomen: no tenderness, no masses palpated. No hepatosplenomegaly. Bowel sounds positive.  Musculoskeletal: no clubbing / cyanosis. No joint deformity upper and lower extremities. Good ROM, no contractures. Normal muscle tone.  Skin: no rashes, lesions, ulcers. No induration Neurologic: CN 2-12 grossly intact. Sensation intact, DTR normal. Strength 5/5 in all 4.  Psychiatric: Normal judgment and insight. Alert and oriented x 3. Normal mood.   Labs on Admission: I have personally reviewed following labs and imaging studies  CBC: Recent Labs  Lab 05/19/20 1500  WBC 13.8*  NEUTROABS 12.6*  HGB 13.5  HCT 41.5  MCV 78.6*  PLT 010    Basic Metabolic Panel: Recent Labs  Lab 05/19/20 1500  NA 141  K 3.5  CL 102  CO2 27  GLUCOSE 132*  BUN 28*  CREATININE 1.67*  CALCIUM 8.0*  MG 2.8*  PHOS 3.3    GFR: CrCl cannot be calculated (Unknown ideal weight.).  Liver Function Tests: Recent Labs  Lab 05/19/20 1500  AST 89*  ALT 76*  ALKPHOS 91  BILITOT 0.7  PROT 7.5  ALBUMIN 3.5    Urine analysis: No results found for: COLORURINE, APPEARANCEUR, LABSPEC, PHURINE, GLUCOSEU,  HGBUR, BILIRUBINUR, KETONESUR, PROTEINUR, UROBILINOGEN, NITRITE, LEUKOCYTESUR  Radiological Exams on Admission: DG Chest Port 1 View  Result Date: 05/19/2020 CLINICAL DATA:  Shortness of breath, COVID, hypoxia EXAM: PORTABLE CHEST 1 VIEW COMPARISON:  03/29/2014 FINDINGS: The heart size and mediastinal contours are within normal limits. Subtle bilateral heterogeneous airspace opacity. The visualized skeletal structures are unremarkable. IMPRESSION: Subtle bilateral heterogeneous airspace opacity, generally in keeping with reported COVID infection. Electronically Signed   By: Eddie Candle M.D.   On: 05/19/2020 15:58    EKG: Independently reviewed. EKG with normal sinus rhythm. Rate of 86.  Assessment/Plan Principal Problem:   Acute respiratory failure due to COVID-19 [facility]) Active Problems:   HTN (hypertension)   Bartters syndrome (HCC)   History of panic attacks   Hyperlipidemia   Anxiety   1 acute respiratory failure with hypoxia secondary to  COVID-19 pneumonia Patient recently diagnosed with COVID-19 05/17/2020. Patient showed picture of his results on his cell phone. Patient went to infusion clinic for monoclonal antibody and noted to have sats in the seventies to 80% on room air. Patient sent to the ED. Patient noted to be on 6 L nasal cannula with sats of 91 to 92%. CRP elevated at 20.8, ferritin at 672, procalcitonin negative, LDH 507, lactic acid at 1.7, CBC with a leukocytosis with a white count of 13.8, D-dimer elevated at 3.41, fibrinogen at 726. Chest x-ray with subtle bilateral heterogeneous airspace opacity consistent with COVID-19 infection. Due to patient's increased oxygenation requirements, elevated CRP, D-dimer/inflammatory markers will admit patient to the stepdown unit for closer monitoring. Will place on Solu-Medrol 60 mg IV every 12 hours, IV remdesivir, baricitinib, vitamin C, zinc, Combivent, Claritin, Flonase, PPI. Follow.  2. Hyperlipidemia Continue home regimen fish  oil.  3. Hypertension  Patient denies hypertension states he has whitecoat hypertension and not on any antihypertensive medications. Hydralazine as needed. Follow for now.  4. Bartter's syndrome Continue home regimen amiloride, magnesium supplementation, potassium supplementation.  5. Anxiety Resume home regimen anxiolytics.  DVT prophylaxis: Heparin Code Status:   Full Family Communication: Updated patient.  No family at bedside. Disposition Plan:   Patient is from:  Home  Anticipated DC to:  Home  Anticipated DC date:  To be determined  Anticipated DC barriers: Hypoxia/clinical improvement  Consults called:  None Admission status:  Admit to inpatient/stepdown unit  Severity of Illness:     Irine Seal MD [facility]  How to contact the [facility] Attending or Consulting provider [place] or covering provider during after hours [place], for this patient?   1. Check the care team in [facility] and look for a) attending/consulting TR[facility] provider listed and b) the [facility] team listed 2. Log into [email or web address] and use [facility]'s universal password to access. If you do not have the password, please contact the hospital operator. 3. Locate the [facility] provider you are looking for under [facility] and page to a number that you can be directly reached. 4. If you still have difficulty reaching the provider, please page the [facility] (Director on Call) for the Hospitalists listed on amion for assistance.  05/19/2020, 6:23 PM

## 2020-05-20 ENCOUNTER — Telehealth: Payer: Self-pay

## 2020-05-20 ENCOUNTER — Inpatient Hospital Stay (HOSPITAL_COMMUNITY): Payer: 59

## 2020-05-20 ENCOUNTER — Telehealth: Payer: Self-pay | Admitting: Family Medicine

## 2020-05-20 DIAGNOSIS — U071 COVID-19: Secondary | ICD-10-CM

## 2020-05-20 DIAGNOSIS — R7989 Other specified abnormal findings of blood chemistry: Secondary | ICD-10-CM

## 2020-05-20 LAB — CBC WITH DIFFERENTIAL/PLATELET
Abs Immature Granulocytes: 0.15 10*3/uL — ABNORMAL HIGH (ref 0.00–0.07)
Basophils Absolute: 0 10*3/uL (ref 0.0–0.1)
Basophils Relative: 0 %
Eosinophils Absolute: 0 10*3/uL (ref 0.0–0.5)
Eosinophils Relative: 0 %
HCT: 39.8 % (ref 39.0–52.0)
Hemoglobin: 12.8 g/dL — ABNORMAL LOW (ref 13.0–17.0)
Immature Granulocytes: 1 %
Lymphocytes Relative: 4 %
Lymphs Abs: 0.5 10*3/uL — ABNORMAL LOW (ref 0.7–4.0)
MCH: 25.4 pg — ABNORMAL LOW (ref 26.0–34.0)
MCHC: 32.2 g/dL (ref 30.0–36.0)
MCV: 79 fL — ABNORMAL LOW (ref 80.0–100.0)
Monocytes Absolute: 0.4 10*3/uL (ref 0.1–1.0)
Monocytes Relative: 3 %
Neutro Abs: 11.5 10*3/uL — ABNORMAL HIGH (ref 1.7–7.7)
Neutrophils Relative %: 92 %
Platelets: 308 10*3/uL (ref 150–400)
RBC: 5.04 MIL/uL (ref 4.22–5.81)
RDW: 13.6 % (ref 11.5–15.5)
WBC: 12.6 10*3/uL — ABNORMAL HIGH (ref 4.0–10.5)
nRBC: 0 % (ref 0.0–0.2)

## 2020-05-20 LAB — MAGNESIUM: Magnesium: 2.7 mg/dL — ABNORMAL HIGH (ref 1.7–2.4)

## 2020-05-20 LAB — COMPREHENSIVE METABOLIC PANEL
ALT: 83 U/L — ABNORMAL HIGH (ref 0–44)
AST: 93 U/L — ABNORMAL HIGH (ref 15–41)
Albumin: 3.2 g/dL — ABNORMAL LOW (ref 3.5–5.0)
Alkaline Phosphatase: 93 U/L (ref 38–126)
Anion gap: 13 (ref 5–15)
BUN: 29 mg/dL — ABNORMAL HIGH (ref 6–20)
CO2: 28 mmol/L (ref 22–32)
Calcium: 8.4 mg/dL — ABNORMAL LOW (ref 8.9–10.3)
Chloride: 106 mmol/L (ref 98–111)
Creatinine, Ser: 1.55 mg/dL — ABNORMAL HIGH (ref 0.61–1.24)
GFR calc Af Amer: >60 mL/min (ref >60)
GFR calc non Af Amer: 54 mL/min — ABNORMAL LOW (ref >60)
Glucose, Bld: 148 mg/dL — ABNORMAL HIGH (ref 70–99)
Potassium: 4.8 mmol/L (ref 3.5–5.1)
Sodium: 147 mmol/L — ABNORMAL HIGH (ref 135–145)
Total Bilirubin: 0.5 mg/dL (ref 0.3–1.2)
Total Protein: 7.4 g/dL (ref 6.5–8.1)

## 2020-05-20 LAB — FERRITIN: Ferritin: 729 ng/mL — ABNORMAL HIGH (ref 24–336)

## 2020-05-20 LAB — C-REACTIVE PROTEIN: CRP: 21.8 mg/dL — ABNORMAL HIGH (ref <1.0)

## 2020-05-20 LAB — D-DIMER, QUANTITATIVE: D-Dimer, Quant: 3.62 ug/mL-FEU — ABNORMAL HIGH (ref 0.00–0.50)

## 2020-05-20 LAB — HIV ANTIBODY (ROUTINE TESTING W REFLEX): HIV Screen 4th Generation wRfx: NONREACTIVE

## 2020-05-20 LAB — PHOSPHORUS: Phosphorus: 3.6 mg/dL (ref 2.5–4.6)

## 2020-05-20 MED ORDER — ENOXAPARIN SODIUM 40 MG/0.4ML ~~LOC~~ SOLN
40.0000 mg | SUBCUTANEOUS | Status: DC
  Administered 2020-05-20 – 2020-05-27 (×8): 40 mg via SUBCUTANEOUS
  Filled 2020-05-20 (×8): qty 0.4

## 2020-05-20 MED ORDER — SODIUM CHLORIDE 0.45 % IV SOLN: INTRAVENOUS | Status: DC

## 2020-05-20 MED ORDER — BARICITINIB 2 MG PO TABS
4.0000 mg | ORAL_TABLET | Freq: Every day | ORAL | Status: DC
  Administered 2020-05-21 – 2020-05-27 (×7): 4 mg via ORAL
  Filled 2020-05-20 (×8): qty 2

## 2020-05-20 MED ORDER — PHENOL 1.4 % MT LIQD
1.0000 | OROMUCOSAL | Status: DC | PRN
  Filled 2020-05-20: qty 177

## 2020-05-20 MED ORDER — IOHEXOL 350 MG/ML SOLN
100.0000 mL | Freq: Once | INTRAVENOUS | Status: AC | PRN
  Administered 2020-05-20: 100 mL via INTRAVENOUS

## 2020-05-20 NOTE — Telephone Encounter (Signed)
Pt has been admitted to the hospital. David David to get infusion and his O2 was in the 70's , so he did not get infusion. Pt just wants [name] to know.

## 2020-05-20 NOTE — Telephone Encounter (Signed)
Patient had virtual visit 05/18/20

## 2020-05-20 NOTE — Telephone Encounter (Signed)
FYI

## 2020-05-20 NOTE — Progress Notes (Signed)
PROGRESS NOTE                                                                                                                                                                                                             Patient Demographics:    David Martin, is a 44 y.o. male, DOB - December 19, 1975, YQM:[id number]  Outpatient Primary MD for the patient is [name], MD    LOS - 1  Admit date - 05/19/2020    Chief Complaint  Patient presents with  . Covid Positive  . Shortness of Breath       Brief Narrative 43 year old gentleman history of anxiety, hyperlipidemia, Bartter syndrome recently diagnosed with COVID-19 on 05/17/2020 sent to ER from infusion clinic with noted hypoxia 79% on room air.  Needing 6 L supplemental oxygen.  His symptoms are started about 9/10. Not vaccinated against COVID-19.  In the emergency room, needing 6 L of oxygen.  Chest x-ray with bilateral pneumonia.   Subjective:    David Martin today has, No headache, No chest pain, No abdominal pain - No Nausea, No new weakness tingling or numbness, has cough.  Not mobilized yet.  No more dizziness.  His main problem is pain on coughing on his throat.  Assessment  & Plan :    Principal Problem:   Acute respiratory failure due to COVID-19 [facility]) Active Problems:   HTN (hypertension)   Bartters syndrome (HCC)   History of panic attacks   Hyperlipidemia   Anxiety   1. Acute Hypoxic Resp. Failure due to Acute Covid 19 Viral Pneumonitis during the ongoing 2020 Covid 19 Pandemic -   Encouraged the patient to sit up in chair in the daytime use I-S and flutter valve for pulmonary toiletry and then prone in bed when at night.  Will advance activity and titrate down oxygen as possible.  Baricitinib  off label use - patient was prescribed baricitinib with his consent.  We will continue. Continue to monitor due to significant symptoms  chest physiotherapy,  incentive spirometry, deep breathing exercises, sputum induction, mucolytic's and bronchodilators. Supplemental oxygen to keep saturations more than 90%. Covid directed therapy with , steroids, high-dose Solu-Medrol remdesivir, day 2/5 Due to severity of symptoms, patient will need daily inflammatory markers, chest x-rays, liver function test to monitor and direct COVID-19 therapies. With elevated D-dimer,  will check duplexes of the lower extremity and CTA of the chest.    SpO2: 96 % O2 Flow Rate (L/min): 5 L/min  Recent Labs  Lab 05/19/20 1500 05/19/20 1800 05/20/20 0256  WBC 13.8*  --  12.6*  PLT 296  --  308  CRP 20.8*  --  21.8*  DDIMER 3.41*  --  3.62*  PROCALCITON <0.10  --   --   AST 89*  --  93*  ALT 76*  --  83*  ALKPHOS 91  --  93  BILITOT 0.7  --  0.5  ALBUMIN 3.5  --  3.2*  LATICACIDVEN 1.7 1.2  --       2.  Bartter syndrome with chronic hypokalemia: Potassium is normal.  On high-dose potassium that we will continue.  3.  Abnormal renal function: Acute kidney injury with underlying history of chronic kidney disease, known baseline of 1.3-1.4.  Treated with IV fluids with improvement.  Sodium is up.  Will change to half-normal saline.  4.  Hypertension: Discontinue amiloride until blood pressure stabilizes.  Discontinue all blood pressure medications.       Condition -fair  Family Communication  : Patient stated he will communicate with his family himself.  Asked no need to call.  Code Status : Full code  Consults  : None  Procedures  : None  PUD Prophylaxis : protonix  Disposition Plan  : Home.  Status is: Inpatient  Remains inpatient appropriate because:Inpatient level of care appropriate due to severity of illness   Dispo: The patient is from: Home              Anticipated d/c is to: Home              Anticipated d/c date is: 2 days              Patient currently is not medically stable to d/c.      DVT Prophylaxis  : Heparin  subcu  Lab Results  Component Value Date   PLT 308 05/20/2020    Diet :  Diet Order            Diet regular Room service appropriate? Yes; Fluid consistency: Thin  Diet effective now                  Inpatient Medications  Scheduled Meds: . vitamin C  500 mg Oral Daily  . baricitinib  2 mg Oral Daily  . Chlorhexidine Gluconate Cloth  6 each Topical Daily  . fluticasone  2 spray Each Nare Daily  . heparin  5,000 Units Subcutaneous Q8H  . Ipratropium-Albuterol  1 puff Inhalation Q6H  . loratadine  10 mg Oral Daily  . magnesium chloride  4 tablet Oral TID  . methylPREDNISolone (SOLU-MEDROL) injection  60 mg Intravenous Q12H  . omega-3 acid ethyl esters  1 capsule Oral BID  . pantoprazole  40 mg Oral Daily  . potassium chloride SA  60 mEq Oral TID  . sodium chloride flush  3 mL Intravenous Q12H  . zinc sulfate  220 mg Oral Daily   Continuous Infusions: . sodium chloride 100 mL/hr at 05/20/20 0839  . remdesivir 100 mg in NS 100 mL 100 mg (05/20/20 1020)   PRN Meds:.acetaminophen, ALPRAZolam, chlorpheniramine-HYDROcodone, guaiFENesin-dextromethorphan, magnesium citrate, ondansetron **OR** ondansetron (ZOFRAN) IV, phenol, senna-docusate, sorbitol, traMADol  Antibiotics  :    Anti-infectives (From admission, onward)   Start     Dose/Rate Route Frequency Ordered Stop  05/20/20 1000  remdesivir 100 mg in sodium chloride 0.9 % 100 mL IVPB       "Followed by" Linked Group Details   100 mg 200 mL/hr over 30 Minutes Intravenous Daily 05/19/20 1536 05/24/20 0959   05/19/20 1600  remdesivir 200 mg in sodium chloride 0.9% 250 mL IVPB       "Followed by" Linked Group Details   200 mg 580 mL/hr over 30 Minutes Intravenous Once 05/19/20 1536 05/19/20 1814       Time Spent: 35 minutes   Barb Merino M.D on 05/20/2020 at 10:23 AM  To page go to [email or web address] - password [facility]  [facility] -  Office  [phone]        Objective:   Vitals:   05/20/20 0400  05/20/20 0500 05/20/20 0600 05/20/20 0800  BP: (!) 99/41 124/71 (!) 88/38   Pulse: 62 82 63   Resp: (!) 23 (!) 29 (!) 22   Temp:    98.4 F (36.9 C)  TempSrc:    Oral  SpO2: 93% (!) 89% 96%   Weight:      Height:        Wt Readings from Last 3 Encounters:  05/19/20 75.6 kg  10/24/19 74.9 kg  03/31/18 77.8 kg     Intake/Output Summary (Last 24 hours) at 05/20/2020 1023 Last data filed at 05/20/2020 0645 Gross per 24 hour  Intake 1192.74 ml  Output 2000 ml  Net -807.26 ml     Physical Exam  Awake Alert, No new F.N deficits, Normal affect Supple Neck,No JVD, No cervical lymphadenopathy appriciated.  Symmetrical Chest wall movement, Good air movement bilaterally, CTAB RRR,No Gallops,Rubs or new Murmurs, No Parasternal Heave +ve B.Sounds, Abd Soft, No tenderness, No organomegaly appriciated, No rebound - guarding or rigidity. No Cyanosis, Clubbing or edema, No new Rash or bruise      Data Review:    CBC Recent Labs  Lab 05/19/20 1500 05/20/20 0256  WBC 13.8* 12.6*  HGB 13.5 12.8*  HCT 41.5 39.8  PLT 296 308  MCV 78.6* 79.0*  MCH 25.6* 25.4*  MCHC 32.5 32.2  RDW 13.4 13.6  LYMPHSABS 0.6* 0.5*  MONOABS 0.4 0.4  EOSABS 0.0 0.0  BASOSABS 0.0 0.0    Recent Labs  Lab 05/19/20 1500 05/19/20 1800 05/20/20 0256  NA 141  --  147*  K 3.5  --  4.8  CL 102  --  106  CO2 27  --  28  GLUCOSE 132*  --  148*  BUN 28*  --  29*  CREATININE 1.67*  --  1.55*  CALCIUM 8.0*  --  8.4*  AST 89*  --  93*  ALT 76*  --  83*  ALKPHOS 91  --  93  BILITOT 0.7  --  0.5  ALBUMIN 3.5  --  3.2*  MG 2.8*  --  2.7*  CRP 20.8*  --  21.8*  DDIMER 3.41*  --  3.62*  PROCALCITON <0.10  --   --   LATICACIDVEN 1.7 1.2  --     ------------------------------------------------------------------------------------------------------------------ Recent Labs    05/19/20 1500  TRIG 83    Lab Results  Component Value Date   HGBA1C 6.1 06/28/2018    ------------------------------------------------------------------------------------------------------------------ No results for input(s): TSH, T4TOTAL, T3FREE, THYROIDAB in the last 72 hours.  Invalid input(s): FREET3  Cardiac Enzymes No results for input(s): CKMB, TROPONINI, MYOGLOBIN in the last 168 hours.  Invalid input(s): CK ------------------------------------------------------------------------------------------------------------------ No results found for: BNP  Micro Results Recent Results (from the past 240 hour(s))  Blood Culture (routine x 2)     Status: None (Preliminary result)   Collection Time: 05/19/20  3:02 PM   Specimen: BLOOD  Result Value Ref Range Status   Specimen Description   Final    BLOOD LEFT ANTECUBITAL Performed at [facility], [place] [address], [place], [place] [address]    Special Requests   Final    BOTTLES DRAWN AEROBIC AND ANAEROBIC Blood Culture adequate volume Performed at [facility] [address], [place], [place] [address]    Culture PENDING  Incomplete   Report Status PENDING  Incomplete  Blood Culture (routine x 2)     Status: None (Preliminary result)   Collection Time: 05/19/20  3:07 PM   Specimen: BLOOD  Result Value Ref Range Status   Specimen Description   Final    BLOOD RIGHT ANTECUBITAL Performed at [facility], [place] [address] [place], [place] [address]    Special Requests   Final    BOTTLES DRAWN AEROBIC AND ANAEROBIC Blood Culture adequate volume Performed at [facility] [address] [place], [place] [address]    Culture PENDING  Incomplete   Report Status PENDING  Incomplete  MRSA PCR Screening     Status: None   Collection Time: 05/19/20  7:04 PM   Specimen: Nasopharyngeal  Result Value Ref Range Status   MRSA by PCR NEGATIVE NEGATIVE Final    Comment:        The GeneXpert MRSA Assay (FDA approved for NASAL specimens only), is one component of  a comprehensive MRSA colonization surveillance program. It is not intended to diagnose MRSA infection nor to guide or monitor treatment for MRSA infections. Performed at [facility], [place] [address] [place], [place] [address]     Radiology Reports DG Chest [place] 1 View  Result Date: 05/19/2020 CLINICAL DATA:  Shortness of breath, COVID, hypoxia EXAM: PORTABLE CHEST 1 VIEW COMPARISON:  03/29/2014 FINDINGS: The heart size and mediastinal contours are within normal limits. Subtle bilateral heterogeneous airspace opacity. The visualized skeletal structures are unremarkable. IMPRESSION: Subtle bilateral heterogeneous airspace opacity, generally in keeping with reported COVID infection. Electronically Signed   By: Eddie Candle M.D.   On: 05/19/2020 15:58

## 2020-05-20 NOTE — Telephone Encounter (Signed)
Patient is currently in the hospital..    Marland KitchenNurse Assessment Nurse: Jimmye Norman, RN, Ailene Ravel Date/Time (Eastern Time): 05/18/2020 10:22:49 AM Confirm and document reason for call. If symptomatic, describe symptoms. ---Caller states he was dx with COVID today and having extreme tiredness and low back ache. Dizziness in the morning. No fever, no SOB Has the patient had close contact with a person known or suspected to have the novel coronavirus illness OR traveled / lives in area with major community spread (including international travel) in the last 14 days from the onset of symptoms? * If Asymptomatic, screen for exposure and travel within the last 14 days. ---No Does the patient have any new or worsening symptoms? ---Yes Will a triage be completed? ---Yes Related visit to physician within the last 2 weeks? ---No Does the PT have any chronic conditions? (i.e. diabetes, asthma, this includes High risk factors for pregnancy, etc.) ---No Is this a behavioral health or substance abuse call? ---No Guidelines Guideline Title Affirmed Question Affirmed Notes Nurse Date/Time ([place] Time) COVID-19 - Diagnosed or Suspected [1] COVID-19 diagnosed by positive lab test AND [2] mild symptoms (e.g., cough, fever, others) AND [8] no complications or SOB Guettler, RN, Ailene Ravel 05/18/2020 10:24:12 AM Disp. Time Eilene Ghazi Time) Disposition Final User 05/18/2020 10:33:51 AM Paged On Call back to [facility], RN, MeredithPLEASE NOTE: All timestamps contained within this report are represented as [place] Standard Time. CONFIDENTIALTY NOTICE: This fax transmission is intended only for the addressee. It contains information that is legally privileged, confidential or otherwise protected from use or disclosure. If you are not the intended recipient, you are strictly prohibited from reviewing, disclosing, copying using or disseminating any of this information or taking any action in reliance on or  regarding this information. If you have received this fax in error, please notify us immediately by telephone so that we can arrange for its return to [place]. Phone: [phone], Toll-Free: [phone], Fax: [phone] Page: 2 of 2 Call Id: [id number] 05/18/2020 10:27:08 AM Home Care Yes Jimmye Norman, RN, Ricka Burdock Disagree/Comply Comply Caller Understands Yes PreDisposition Call Doctor Care Advice Given Per Guideline HOME CARE: * You should be able to treat this at home. REASSURANCE AND EDUCATION - POSITIVE COVID-19 LAB TEST AND MILD SYMPTOMS: * You had a lab test for COVID-19 and it came back positive. * The diagnosis has been confirmed. * Here's some care advice to help you and to help prevent others from getting sick. * From what you have told me, your symptoms are mild. That is reassuring. * If the air is dry, use a humidifier in the bedroom. * Dry air makes coughs worse. HUMIDIFIER: PAIN AND FEVER MEDICINES - EXTRA NOTES AND WARNINGS: * Use the lowest amount of medicine that makes your pain or fever better. * Acetaminophen is thought to be safer than ibuprofen or naproxen in people over 90 years old. Acetaminophen is in many OTC and prescription medicines. It might be in more than one medicine that you are taking. You need to be careful and not take an overdose. An acetaminophen overdose can hurt the liver. NOTE TO TRIAGER - IBUPROFEN CONCERNS: * Discuss only if caller brings up concerns about ibuprofen. * RESPONSE: The CDC, WHO, and other experts continue to support the use of ibuprofen (if needed) for patients with COVID-19. They found no scientific evidence to support the claim that ibuprofen made COVID-19 worse. * Fever over 103 F (39.4 C) CALL BACK IF: * Fever lasts over 3 days * Fever returns after  being gone for 24 hours * Chest pain or difficulty breathing occurs * You become worse CARE ADVICE given per COVID-19 - DIAGNOSED OR SUSPECTED (Adult) guideline. Paging DoctorName Phone  DateTime Result/Outcome Message Type Notes Billey Chang- MD [phone] 05/18/2020 10:33:51 AM Called On Call Provider - Left Message Doctor Paged Billey Chang- MD 05/18/2020 10:41:01 AM Spoke with On Call - General Message Result MD to call patient bac

## 2020-05-20 NOTE — Progress Notes (Signed)
This RN made aware that patient has home meds in his pocket. When asked, the patient stated it is potassium, magnesium, and xanax. Patient stated that he has not had any of his home meds but likes to keep them on him for comfort. This RN educated on the importance of letting staff know if he wants one of his home meds and he was agreeable. Patient did not want to lock up medications at this time. Will continue to monitor for needs.

## 2020-05-20 NOTE — Progress Notes (Signed)
Lower extremity venous bilateral study completed.   Please see CV Proc for preliminary results.   David Martin  

## 2020-05-21 LAB — CBC WITH DIFFERENTIAL/PLATELET
Abs Immature Granulocytes: 0.46 10*3/uL — ABNORMAL HIGH (ref 0.00–0.07)
Basophils Absolute: 0 10*3/uL (ref 0.0–0.1)
Basophils Relative: 0 %
Eosinophils Absolute: 0 10*3/uL (ref 0.0–0.5)
Eosinophils Relative: 0 %
HCT: 36.5 % — ABNORMAL LOW (ref 39.0–52.0)
Hemoglobin: 11.8 g/dL — ABNORMAL LOW (ref 13.0–17.0)
Immature Granulocytes: 3 %
Lymphocytes Relative: 8 %
Lymphs Abs: 1.4 10*3/uL (ref 0.7–4.0)
MCH: 25.7 pg — ABNORMAL LOW (ref 26.0–34.0)
MCHC: 32.3 g/dL (ref 30.0–36.0)
MCV: 79.3 fL — ABNORMAL LOW (ref 80.0–100.0)
Monocytes Absolute: 0.7 10*3/uL (ref 0.1–1.0)
Monocytes Relative: 4 %
Neutro Abs: 14.3 10*3/uL — ABNORMAL HIGH (ref 1.7–7.7)
Neutrophils Relative %: 85 %
Platelets: 333 10*3/uL (ref 150–400)
RBC: 4.6 MIL/uL (ref 4.22–5.81)
RDW: 13.6 % (ref 11.5–15.5)
WBC: 16.8 10*3/uL — ABNORMAL HIGH (ref 4.0–10.5)
nRBC: 0.1 % (ref 0.0–0.2)

## 2020-05-21 LAB — COMPREHENSIVE METABOLIC PANEL
ALT: 87 U/L — ABNORMAL HIGH (ref 0–44)
AST: 67 U/L — ABNORMAL HIGH (ref 15–41)
Albumin: 2.8 g/dL — ABNORMAL LOW (ref 3.5–5.0)
Alkaline Phosphatase: 80 U/L (ref 38–126)
Anion gap: 11 (ref 5–15)
BUN: 32 mg/dL — ABNORMAL HIGH (ref 6–20)
CO2: 29 mmol/L (ref 22–32)
Calcium: 8.1 mg/dL — ABNORMAL LOW (ref 8.9–10.3)
Chloride: 104 mmol/L (ref 98–111)
Creatinine, Ser: 1.42 mg/dL — ABNORMAL HIGH (ref 0.61–1.24)
GFR calc Af Amer: >60 mL/min (ref >60)
GFR calc non Af Amer: >60 mL/min (ref >60)
Glucose, Bld: 180 mg/dL — ABNORMAL HIGH (ref 70–99)
Potassium: 3.3 mmol/L — ABNORMAL LOW (ref 3.5–5.1)
Sodium: 144 mmol/L (ref 135–145)
Total Bilirubin: 0.5 mg/dL (ref 0.3–1.2)
Total Protein: 6.4 g/dL — ABNORMAL LOW (ref 6.5–8.1)

## 2020-05-21 LAB — PHOSPHORUS: Phosphorus: 4.2 mg/dL (ref 2.5–4.6)

## 2020-05-21 LAB — MAGNESIUM: Magnesium: 2.7 mg/dL — ABNORMAL HIGH (ref 1.7–2.4)

## 2020-05-21 LAB — D-DIMER, QUANTITATIVE: D-Dimer, Quant: 3.97 ug/mL-FEU — ABNORMAL HIGH (ref 0.00–0.50)

## 2020-05-21 LAB — FERRITIN: Ferritin: 805 ng/mL — ABNORMAL HIGH (ref 24–336)

## 2020-05-21 LAB — C-REACTIVE PROTEIN: CRP: 11.3 mg/dL — ABNORMAL HIGH (ref <1.0)

## 2020-05-21 MED ORDER — AMILORIDE HCL 5 MG PO TABS
5.0000 mg | ORAL_TABLET | Freq: Every day | ORAL | Status: DC
  Administered 2020-05-21 – 2020-05-27 (×7): 5 mg via ORAL
  Filled 2020-05-21 (×8): qty 1

## 2020-05-21 NOTE — Progress Notes (Signed)
°  Pt attempted to ambulate to bathroom on room air. Desat to 65-69% on RA and c/o of dizziness and feeling faint. 10L required for 4-5 minutes to recover to 89%. Following ambulation pt sats 86-88% increased resting O2 to 7L. Will plan to wean back down.

## 2020-05-21 NOTE — Progress Notes (Signed)
PROGRESS NOTE    David Martin  EUM:[id number] DOB: 03/07/1976 DOA: 05/19/2020 PCP: [name], MD    Brief Narrative:  44 year old gentleman history of anxiety, hyperlipidemia, Bartter syndrome recently diagnosed with COVID-19 on 05/17/2020 sent to ER from infusion clinic with noted hypoxia 79% on room air.  Needing 6 L supplemental oxygen.  His symptoms are started about 9/10.  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  He is yet to be vaccinated against COVID-19.   In the emergency room, needing 6 L of oxygen.  Chest x-ray with bilateral pneumonia.   Assessment & Plan:   Principal Problem:   Acute respiratory failure due to COVID-19 [facility]) Active Problems:   HTN (hypertension)   Bartters syndrome (HCC)   History of panic attacks   Hyperlipidemia   Anxiety  Acute hypoxemic respiratory failure due to COVID-19 virus infection: Continue to monitor due to significant symptoms, still on significant oxygen. chest physiotherapy, incentive spirometry, deep breathing exercises, sputum induction, mucolytic's and bronchodilators. Supplemental oxygen to keep saturations more than 90%. Covid directed therapy with , steroids, on high-dose Solu-Medrol remdesivir, day 3/5 Baricitinib, renally dosed, day 3/14 or until hospital discharge antibiotics, not indicated Due to severity of symptoms, patient will need daily inflammatory markers, chest x-rays, liver function test to monitor and direct COVID-19 therapies. CTA of the chest negative for pulmonary embolism. Lower extremity duplex is negative for thromboembolism.  COVID-19 Labs  Recent Labs    05/19/20 1500 05/20/20 0256 05/21/20 0519  DDIMER 3.41* 3.62* 3.97*  FERRITIN 672* 729* 805*  LDH 507*  --   --   CRP 20.8* 21.8* 11.3*    No results found for: SARSCOV2NAA, has positive test on 9/17 done outpatient.  Patient has records with him.  Bartter syndrome with chronic hypokalemia: Patient with  significant history of Bartter syndrome.  He is on very high doses of potassium and magnesium.  Currently stable.  Continue to monitor.  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  Initially hypotensive, treated with isotonic fluid.  Improved.  Discontinue fluid.  Hypertension: Stabilized now.  Resume amiloride.   DVT prophylaxis: enoxaparin (LOVENOX) injection 40 mg Start: 05/20/20 1600   Code Status: Full code Family Communication: Wife on the phone Disposition Plan: Status is: Inpatient  Remains inpatient appropriate because:Inpatient level of care appropriate due to severity of illness   Dispo:  Patient From: Home  Planned Disposition: Home  Expected discharge date: 05/22/20  Medically stable for discharge: No  Consultants:   None  Procedures:   None  Antimicrobials:   Remdesivir, 9/19   Subjective: Patient seen and examined.  Remains about 5 L oxygen.  Feels tired.  Less dizziness today but not walked yet.  Unable to take deep breaths with dry cough.  Afebrile overnight.  Objective: Vitals:   05/20/20 1909 05/20/20 2001 05/20/20 2352 05/21/20 0559  BP: 118/72 131/72 129/77 130/71  Pulse: 66 73 81 68  Resp: 20 18 19 18   Temp: 99.7 F (37.6 C) 98 F (36.7 C) 99.3 F (37.4 C) 98.5 F (36.9 C)  TempSrc: Oral Oral Oral Oral  SpO2: 94% 96% 91% 92%  Weight:      Height:        Intake/Output Summary (Last 24 hours) at 05/21/2020 1247 Last data filed at 05/21/2020 0908 Gross per 24 hour  Intake 2429.07 ml  Output 2675 ml  Net -245.93 ml   Filed Weights   05/19/20  1900  Weight: 75.6 kg    Examination:  General exam: Appears calm and comfortable at rest.  Slightly anxious. Respiratory system: Clear to auscultation. Respiratory effort normal.  Mostly conducted airway sounds. Cardiovascular system: S1 & S2 heard, RRR. No JVD, murmurs, rubs, gallops or clicks. No pedal edema. Gastrointestinal system: Abdomen  is nondistended, soft and nontender. No organomegaly or masses felt. Normal bowel sounds heard. Central nervous system: Alert and oriented. No focal neurological deficits. Extremities: Symmetric 5 x 5 power. Skin: No rashes, lesions or ulcers Psychiatry: Judgement and insight appear normal. Mood & affect appropriate.     Data Reviewed: I have personally reviewed following labs and imaging studies  CBC: Recent Labs  Lab 05/19/20 1500 05/20/20 0256 05/21/20 0519  WBC 13.8* 12.6* 16.8*  NEUTROABS 12.6* 11.5* 14.3*  HGB 13.5 12.8* 11.8*  HCT 41.5 39.8 36.5*  MCV 78.6* 79.0* 79.3*  PLT 296 308 527   Basic Metabolic Panel: Recent Labs  Lab 05/19/20 1500 05/20/20 0256 05/21/20 0519  NA 141 147* 144  K 3.5 4.8 3.3*  CL 102 106 104  CO2 27 28 29   GLUCOSE 132* 148* 180*  BUN 28* 29* 32*  CREATININE 1.67* 1.55* 1.42*  CALCIUM 8.0* 8.4* 8.1*  MG 2.8* 2.7* 2.7*  PHOS 3.3 3.6 4.2   GFR: Estimated Creatinine Clearance: 69.3 mL/min (A) (by C-G formula based on SCr of 1.42 mg/dL (H)). Liver Function Tests: Recent Labs  Lab 05/19/20 1500 05/20/20 0256 05/21/20 0519  AST 89* 93* 67*  ALT 76* 83* 87*  ALKPHOS 91 93 80  BILITOT 0.7 0.5 0.5  PROT 7.5 7.4 6.4*  ALBUMIN 3.5 3.2* 2.8*   No results for input(s): LIPASE, AMYLASE in the last 168 hours. No results for input(s): AMMONIA in the last 168 hours. Coagulation Profile: No results for input(s): INR, PROTIME in the last 168 hours. Cardiac Enzymes: No results for input(s): CKTOTAL, CKMB, CKMBINDEX, TROPONINI in the last 168 hours. BNP (last 3 results) No results for input(s): PROBNP in the last 8760 hours. HbA1C: No results for input(s): HGBA1C in the last 72 hours. CBG: No results for input(s): GLUCAP in the last 168 hours. Lipid Profile: Recent Labs    05/19/20 1500  TRIG 83   Thyroid Function Tests: No results for input(s): TSH, T4TOTAL, FREET4, T3FREE, THYROIDAB in the last 72 hours. Anemia Panel: Recent Labs     05/20/20 0256 05/21/20 0519  FERRITIN 729* 805*   Sepsis Labs: Recent Labs  Lab 05/19/20 1500 05/19/20 1800  PROCALCITON <0.10  --   LATICACIDVEN 1.7 1.2    Recent Results (from the past 240 hour(s))  Blood Culture (routine x 2)     Status: None (Preliminary result)   Collection Time: 05/19/20  3:02 PM   Specimen: BLOOD  Result Value Ref Range Status   Specimen Description   Final    BLOOD LEFT ANTECUBITAL Performed at [facility], [place] [address] [place], [place] [address]    Special Requests   Final    BOTTLES DRAWN AEROBIC AND ANAEROBIC Blood Culture adequate volume Performed at [place] [address], [place], [place] [address]    Culture   Final    NO GROWTH 2 DAYS Performed at [facility] [address] [place], [place] [address]    Report Status PENDING  Incomplete  Blood Culture (routine x 2)     Status: None (Preliminary result)   Collection Time: 05/19/20  3:07 PM  Specimen: BLOOD  Result Value Ref Range Status   Specimen Description   Final    BLOOD RIGHT ANTECUBITAL Performed at [facility], [place] [address] [place], [place] [address]    Special Requests   Final    BOTTLES DRAWN AEROBIC AND ANAEROBIC Blood Culture adequate volume Performed at [place] [address] [place], [place] [address]    Culture   Final    NO GROWTH 2 DAYS Performed at [place] [address], [place], [place] [address]    Report Status PENDING  Incomplete  MRSA PCR Screening     Status: None   Collection Time: 05/19/20  7:04 PM   Specimen: Nasopharyngeal  Result Value Ref Range Status   MRSA by PCR NEGATIVE NEGATIVE Final    Comment:        The GeneXpert MRSA Assay (FDA approved for NASAL specimens only), is one component of a comprehensive MRSA colonization surveillance program. It is not intended to diagnose MRSA infection nor to guide or monitor treatment for MRSA  infections. Performed at [facility], [place] [address], [place], [place] [address]          Radiology Studies: CT ANGIO CHEST PE W OR WO CONTRAST  Result Date: 05/20/2020 CLINICAL DATA:  44 year old male with a history of possible pulmonary embolism EXAM: CT ANGIOGRAPHY CHEST WITH CONTRAST TECHNIQUE: Multidetector CT imaging of the chest was performed using the standard protocol during bolus administration of intravenous contrast. Multiplanar CT image reconstructions and MIPs were obtained to evaluate the vascular anatomy. CONTRAST:  140mL OMNIPAQUE IOHEXOL 350 MG/ML SOLN COMPARISON:  None. FINDINGS: Cardiovascular: Heart: No cardiomegaly. No pericardial fluid/thickening. No significant coronary calcifications. Aorta: Unremarkable course, caliber, contour of the thoracic aorta. No aneurysm or dissection flap. No periaortic fluid. Pulmonary arteries: The timing of the contrast bolus is suboptimal for the most sensitive evaluation for pulmonary emboli, however, there are no filling defects of the main pulmonary artery, lobar arteries, segmental arteries, or proximal subsegmental arteries. Mediastinum/Nodes: No mediastinal adenopathy. Unremarkable appearance of the thoracic esophagus. Unremarkable thoracic inlet Lungs/Pleura: The majority of all 5 lobes of the lung are involved with ground-glass opacity, predominantly in a peripheral distribution. There is more confluent nodular airspace opacity in the dependent aspects of the left lower lobe, at the periphery/subpleural location. No pneumothorax or pleural effusion. No central airway obstruction. No bronchial wall thickening. Upper Abdomen: No acute. Musculoskeletal: No acute displaced fracture. Degenerative changes of the spine. Review of the MIP images confirms the above findings. IMPRESSION: CT negative for pulmonary emboli. Changes of COVID pneumonia, with the majority of all 5 lung lobes affected by ground-glass opacity, as above.  Electronically Signed   By: Corrie Mckusick D.O.   On: 05/20/2020 15:15   DG Chest Port 1 View  Result Date: 05/19/2020 CLINICAL DATA:  Shortness of breath, COVID, hypoxia EXAM: PORTABLE CHEST 1 VIEW COMPARISON:  03/29/2014 FINDINGS: The heart size and mediastinal contours are within normal limits. Subtle bilateral heterogeneous airspace opacity. The visualized skeletal structures are unremarkable. IMPRESSION: Subtle bilateral heterogeneous airspace opacity, generally in keeping with reported COVID infection. Electronically Signed   By: Eddie Candle M.D.   On: 05/19/2020 15:58   VAS [place] LOWER EXTREMITY VENOUS (DVT)  Result Date: 05/20/2020  Lower Venous DVTStudy Indications: D-dimer.  Comparison Study: No prior studies. Performing Technologist: Darlin Coco  Examination Guidelines: A complete evaluation includes B-mode imaging, spectral Doppler, color Doppler, and power Doppler as needed  of all accessible portions of each vessel. Bilateral testing is considered an integral part of a complete examination. Limited examinations for reoccurring indications may be performed as noted. The reflux portion of the exam is performed with the patient in reverse Trendelenburg.  +---------+---------------+---------+-----------+----------+--------------+ RIGHT    CompressibilityPhasicitySpontaneityPropertiesThrombus Aging +---------+---------------+---------+-----------+----------+--------------+ CFV      Full           Yes      Yes                                 +---------+---------------+---------+-----------+----------+--------------+ SFJ      Full                                                        +---------+---------------+---------+-----------+----------+--------------+ FV Prox  Full                                                        +---------+---------------+---------+-----------+----------+--------------+ FV Mid   Full                                                         +---------+---------------+---------+-----------+----------+--------------+ FV DistalFull                                                        +---------+---------------+---------+-----------+----------+--------------+ PFV      Full                                                        +---------+---------------+---------+-----------+----------+--------------+ POP      Full           Yes      Yes                                 +---------+---------------+---------+-----------+----------+--------------+ PTV      Full                                                        +---------+---------------+---------+-----------+----------+--------------+ PERO     Full                                                        +---------+---------------+---------+-----------+----------+--------------+   +---------+---------------+---------+-----------+----------+--------------+ LEFT  CompressibilityPhasicitySpontaneityPropertiesThrombus Aging +---------+---------------+---------+-----------+----------+--------------+ CFV      Full           Yes      Yes                                 +---------+---------------+---------+-----------+----------+--------------+ SFJ      Full                                                        +---------+---------------+---------+-----------+----------+--------------+ FV Prox  Full                                                        +---------+---------------+---------+-----------+----------+--------------+ FV Mid   Full                                                        +---------+---------------+---------+-----------+----------+--------------+ FV DistalFull                                                        +---------+---------------+---------+-----------+----------+--------------+ PFV      Full                                                         +---------+---------------+---------+-----------+----------+--------------+ POP      Full           Yes      Yes                                 +---------+---------------+---------+-----------+----------+--------------+ PTV      Full                                                        +---------+---------------+---------+-----------+----------+--------------+ PERO     Full                                                        +---------+---------------+---------+-----------+----------+--------------+     Summary: RIGHT: - There is no evidence of deep vein thrombosis in the lower extremity.  - No cystic structure found in the popliteal fossa.  LEFT: - There is no evidence of deep vein thrombosis in the lower extremity.  - No cystic structure found in the  popliteal fossa.  *See table(s) above for measurements and observations. Electronically signed by Ruta Hinds MD on 05/20/2020 at 5:20:19 PM.    Final         Scheduled Meds: . aMILoride  5 mg Oral Daily  . vitamin C  500 mg Oral Daily  . baricitinib  4 mg Oral Daily  . enoxaparin (LOVENOX) injection  40 mg Subcutaneous Q24H  . fluticasone  2 spray Each Nare Daily  . Ipratropium-Albuterol  1 puff Inhalation Q6H  . loratadine  10 mg Oral Daily  . magnesium chloride  4 tablet Oral TID  . methylPREDNISolone (SOLU-MEDROL) injection  60 mg Intravenous Q12H  . omega-3 acid ethyl esters  1 capsule Oral BID  . pantoprazole  40 mg Oral Daily  . potassium chloride SA  60 mEq Oral TID  . sodium chloride flush  3 mL Intravenous Q12H  . zinc sulfate  220 mg Oral Daily   Continuous Infusions: . remdesivir 100 mg in NS 100 mL 100 mg (05/21/20 0857)     LOS: 2 days    Time spent: 35 minutes    Barb Merino, MD [facility] Pager [phone]

## 2020-05-22 DIAGNOSIS — Z8659 Personal history of other mental and behavioral disorders: Secondary | ICD-10-CM

## 2020-05-22 LAB — PHOSPHORUS: Phosphorus: 4.4 mg/dL (ref 2.5–4.6)

## 2020-05-22 LAB — COMPREHENSIVE METABOLIC PANEL
ALT: 295 U/L — ABNORMAL HIGH (ref 0–44)
AST: 230 U/L — ABNORMAL HIGH (ref 15–41)
Albumin: 3.2 g/dL — ABNORMAL LOW (ref 3.5–5.0)
Alkaline Phosphatase: 94 U/L (ref 38–126)
Anion gap: 14 (ref 5–15)
BUN: 32 mg/dL — ABNORMAL HIGH (ref 6–20)
CO2: 29 mmol/L (ref 22–32)
Calcium: 8.4 mg/dL — ABNORMAL LOW (ref 8.9–10.3)
Chloride: 102 mmol/L (ref 98–111)
Creatinine, Ser: 1.46 mg/dL — ABNORMAL HIGH (ref 0.61–1.24)
GFR calc Af Amer: >60 mL/min (ref >60)
GFR calc non Af Amer: 58 mL/min — ABNORMAL LOW (ref >60)
Glucose, Bld: 184 mg/dL — ABNORMAL HIGH (ref 70–99)
Potassium: 3.5 mmol/L (ref 3.5–5.1)
Sodium: 145 mmol/L (ref 135–145)
Total Bilirubin: 0.5 mg/dL (ref 0.3–1.2)
Total Protein: 7.1 g/dL (ref 6.5–8.1)

## 2020-05-22 LAB — CBC WITH DIFFERENTIAL/PLATELET
Abs Immature Granulocytes: 0.79 10*3/uL — ABNORMAL HIGH (ref 0.00–0.07)
Basophils Absolute: 0.1 10*3/uL (ref 0.0–0.1)
Basophils Relative: 0 %
Eosinophils Absolute: 0 10*3/uL (ref 0.0–0.5)
Eosinophils Relative: 0 %
HCT: 40.8 % (ref 39.0–52.0)
Hemoglobin: 13.1 g/dL (ref 13.0–17.0)
Immature Granulocytes: 5 %
Lymphocytes Relative: 8 %
Lymphs Abs: 1.5 10*3/uL (ref 0.7–4.0)
MCH: 25.8 pg — ABNORMAL LOW (ref 26.0–34.0)
MCHC: 32.1 g/dL (ref 30.0–36.0)
MCV: 80.3 fL (ref 80.0–100.0)
Monocytes Absolute: 1.2 10*3/uL — ABNORMAL HIGH (ref 0.1–1.0)
Monocytes Relative: 7 %
Neutro Abs: 14 10*3/uL — ABNORMAL HIGH (ref 1.7–7.7)
Neutrophils Relative %: 80 %
Platelets: 331 10*3/uL (ref 150–400)
RBC: 5.08 MIL/uL (ref 4.22–5.81)
RDW: 13.8 % (ref 11.5–15.5)
WBC: 17.5 10*3/uL — ABNORMAL HIGH (ref 4.0–10.5)
nRBC: 0.1 % (ref 0.0–0.2)

## 2020-05-22 LAB — FERRITIN: Ferritin: 651 ng/mL — ABNORMAL HIGH (ref 24–336)

## 2020-05-22 LAB — MAGNESIUM: Magnesium: 2.5 mg/dL — ABNORMAL HIGH (ref 1.7–2.4)

## 2020-05-22 LAB — C-REACTIVE PROTEIN: CRP: 7.4 mg/dL — ABNORMAL HIGH (ref <1.0)

## 2020-05-22 LAB — D-DIMER, QUANTITATIVE: D-Dimer, Quant: 3.97 ug/mL-FEU — ABNORMAL HIGH (ref 0.00–0.50)

## 2020-05-22 MED ORDER — IPRATROPIUM-ALBUTEROL 20-100 MCG/ACT IN AERS
1.0000 | INHALATION_SPRAY | Freq: Four times a day (QID) | RESPIRATORY_TRACT | Status: DC | PRN
  Filled 2020-05-22: qty 4

## 2020-05-22 MED ORDER — HYDROCOD POLST-CPM POLST ER 10-8 MG/5ML PO SUER
5.0000 mL | Freq: Two times a day (BID) | ORAL | Status: DC
  Filled 2020-05-22 (×7): qty 5

## 2020-05-22 MED ORDER — IPRATROPIUM-ALBUTEROL 20-100 MCG/ACT IN AERS
1.0000 | INHALATION_SPRAY | Freq: Four times a day (QID) | RESPIRATORY_TRACT | Status: DC
  Administered 2020-05-22 – 2020-05-25 (×9): 1 via RESPIRATORY_TRACT
  Filled 2020-05-22: qty 4

## 2020-05-22 MED ORDER — ALBUTEROL SULFATE HFA 108 (90 BASE) MCG/ACT IN AERS: 1.0000 | INHALATION_SPRAY | RESPIRATORY_TRACT | Status: DC | PRN

## 2020-05-22 NOTE — Evaluation (Signed)
Physical Therapy Evaluation Patient Details Name: David Martin MRN: [id number] DOB: [birth year] Today's Date: 05/22/2020   History of Present Illness  44 y.o. male with medical history significant of anxiety, hyperlipidemia, Bartter's syndrome,?  Hypertension (patient denies states he has whitecoat hypertension) who recently diagnosed with COVID-19 on 05/17/2020, had presented to Covid infusion clinic to receive monoclonal antibody however noted to have hypoxia with sats of 79 to 82% on room air  Clinical Impression  Pt admitted with above diagnosis. Pt ambulated 100' without an assistive device, SaO2 82% on 6L O2 walking. Instructed pt in standing LE exercises to minimize deconditioning during hospitalization. Pt is eager to return to working out at the gym when he recovers.  Pt currently with functional limitations due to the deficits listed below (see PT Problem List). Pt will benefit from skilled PT to increase their independence and safety with mobility to allow discharge to the venue listed below.       Follow Up Recommendations No PT follow up    Equipment Recommendations  None recommended by PT    Recommendations for Other Services       Precautions / Restrictions Precautions Precautions: Other (comment) Precaution Comments: monitor O2 sats Restrictions Weight Bearing Restrictions: No      Mobility  Bed Mobility Overal bed mobility: Independent                Transfers Overall transfer level: Independent                  Ambulation/Gait Ambulation/Gait assistance: Independent Gait Distance (Feet): 100 Feet Assistive device: None   Gait velocity: WNL   General Gait Details: SaO2 82% on 6L O2 walking  Stairs            Wheelchair Mobility    Modified Rankin (Stroke Patients Only)       Balance Overall balance assessment: Independent                                           Pertinent Vitals/Pain Pain Assessment:  Faces Faces Pain Scale: Hurts even more Pain Location: sore throat Pain Descriptors / Indicators: Sore Pain Intervention(s): Monitored during session    Home Living Family/patient expects to be discharged to:: Private residence Living Arrangements: Spouse/significant other             Home Equipment: None      Prior Function Level of Independence: Independent         Comments: works as Statistician at [employer]        Extremity/Trunk Assessment   Upper Extremity Assessment Upper Extremity Assessment: Overall WFL for tasks assessed    Lower Extremity Assessment Lower Extremity Assessment: Overall WFL for tasks assessed    Cervical / Trunk Assessment Cervical / Trunk Assessment: Normal  Communication   Communication: No difficulties  Cognition Arousal/Alertness: Awake/alert Behavior During Therapy: WFL for tasks assessed/performed Overall Cognitive Status: Within Functional Limits for tasks assessed                                        General Comments      Exercises General Exercises - Lower Extremity Hip Flexion/Marching: AROM;Both;15 reps;Standing Heel Raises: AROM;Both;15 reps;Standing Mini-Sqauts: AROM;Both;15 reps;Standing   Assessment/Plan  PT Assessment Patient needs continued PT services  PT Problem List Decreased activity tolerance;Cardiopulmonary status limiting activity       PT Treatment Interventions Gait training;DME instruction;Therapeutic activities;Therapeutic exercise;Patient/family education    PT Goals (Current goals can be found in the Care Plan section)  Acute Rehab PT Goals Patient Stated Goal: working out at gym PT Goal Formulation: With patient Time For Goal Achievement: 06/05/20 Potential to Achieve Goals: Good    Frequency Min 3X/week   Barriers to discharge        Co-evaluation               AM-PAC PT "6 Clicks" Mobility  Outcome Measure Help needed turning  from your back to your side while in a flat bed without using bedrails?: None Help needed moving from lying on your back to sitting on the side of a flat bed without using bedrails?: None Help needed moving to and from a bed to a chair (including a wheelchair)?: None Help needed standing up from a chair using your arms (e.g., wheelchair or bedside chair)?: None Help needed to walk in hospital room?: None Help needed climbing 3-5 steps with a railing? : None 6 Click Score: 24    End of Session Equipment Utilized During Treatment: Gait belt Activity Tolerance: Patient tolerated treatment well Patient left: in chair;with call bell/phone within reach Nurse Communication: Mobility status PT Visit Diagnosis: Pain;Difficulty in walking, not elsewhere classified (R26.2)    Time: 7824-2353 PT Time Calculation (min) (ACUTE ONLY): 25 min   Charges:   PT Evaluation $PT Eval Low Complexity: 1 Low PT Treatments $Gait Training: 8-22 mins        Blondell Reveal Kistler PT 05/22/2020  Acute Rehabilitation Services Pager [phone] Office [phone]

## 2020-05-22 NOTE — Progress Notes (Signed)
PROGRESS NOTE    David Martin  ZDG:[id number] DOB: [birth year] DOA: 05/19/2020 PCP: [name], MD    Brief Narrative:  Patient admitted to the hospital with working diagnosis of acute hypoxic respiratory failure due to SARS COVID-19 viral pneumonia.  44 year old male with past medical history for anxiety, dyslipidemia, hypertension and Bartter syndrome.  He was diagnosed with COVID-19 on 05/17/2020 his symptoms consistent with generalized fatigue, metallic smell and taste, and intermittent diarrhea for about a week prior to diagnosis.  On 05/19/20 he presented to the outpatient infusion clinic for monoclonal antibody treatment, he was noted to be hypoxemic down to 79 to 82% on room air, and he was placed on supplemental oxygen 4 L/min and he was referred to the hospital.  On his initial physical examination blood pressure 120/79, heart rate 86, respiratory 25, temperature 98.9, oxygen saturation 91% on 6 L/min per nasal cannula.  His lungs had coarse breath sounds bilaterally but no wheezing, heart S1-S2, present rhythm, soft abdomen, no lower extremity edema.  Chest radiograph with bilateral interstitial infiltrates, lower lobes and right upper lobe.   Assessment & Plan:   Principal Problem:   Acute respiratory failure due to COVID-19 [facility]) Active Problems:   HTN (hypertension)   Bartters syndrome (HCC)   History of panic attacks   Hyperlipidemia   Anxiety   1.  Acute hypoxic respiratory failure due to SARS COVID-19 viral pneumonia.   RR: 20  Pulse oxymetry: 99%  Fi02: 5 L/min [place]   COVID-19 Labs  Recent Labs    05/19/20 1500 05/19/20 1500 05/20/20 0256 05/21/20 0519 05/22/20 0354  DDIMER 3.41*   < > 3.62* 3.97* 3.97*  FERRITIN 672*   < > 729* 805* 651*  LDH 507*  --   --   --   --   CRP 20.8*   < > 21.8* 11.3* 7.4*   < > = values in this interval not displayed.    No results found for: [place]  Patient feeling better today but not yet back to baseline,  yesterday had desaturation on ambulation, required increase oxygen flow to improve oxygenation. He has been working with physical therapy and has been out of bed.   CRP trending down, D dimer and ferritin continue to be elevated. Continue medical therapy with Remdesivir #4, Baricitinib and systemic corticosteroids with methylprednisolone 60 mg IV q12 H.  Continue with bronchodilator (add scheduled combivent and as needed albuterol), antitussive agents (change tussionex to scheduled bid) and airway clearing techniques with flutter valve and incentive spirometer.   Ok to discontinue telemetry.   2. CKD stage 2 with Hypokalemia. (base cr 1,4) K is up to 3,5 with serum cr at 1,46 and bicarbonate at 32. Mg is 2.5 Continue K correction with Kcl depending on levels in am, patient is tolerating po well.   3. HTN. Blood pressure controlled, 122/76 mmHg with amiloride.   4. Anxiety. Continue with alprazolam.   Patient continue to be at high risk for worsening respiratory failure.   Status is: Inpatient  Remains inpatient appropriate because:IV treatments appropriate due to intensity of illness or inability to take PO   Dispo:  Patient From: Home  Planned Disposition: Home  Expected discharge date: 05/22/20  Medically stable for discharge: No    DVT prophylaxis: Enoxaparin   Code Status:   full  Family Communication:  No family at the bedside      Subjective: Patient is feeling better, but not yet back to baseline, continue to  have throat pain, no dysphagia. Dyspnea stable.   Objective: Vitals:   05/21/20 0559 05/21/20 1446 05/21/20 1936 05/22/20 0442  BP: 130/71 139/80 121/72 122/76  Pulse: 68 80 65 (!) 45  Resp: 18 19 17 20   Temp: 98.5 F (36.9 C) 98.3 F (36.8 C) 98.6 F (37 C) 98.5 F (36.9 C)  TempSrc: Oral     SpO2: 92% 90% 95% 99%  Weight:      Height:        Intake/Output Summary (Last 24 hours) at 05/22/2020 1119 Last data filed at 05/21/2020 2028 Gross per 24  hour  Intake 460 ml  Output 700 ml  Net -240 ml   Filed Weights   05/19/20 1900  Weight: 75.6 kg    Examination:   General: Not in pain or dyspnea at rest.  Neurology: Awake and alert, non focal  E ENT: no pallor, no icterus, oral mucosa moist Cardiovascular: No JVD. S1-S2 present, rhythmic, no gallops, rubs, or murmurs. No lower extremity edema. Pulmonary:  positive breath sounds bilaterally with no wheezing. Gastrointestinal. Abdomen soft and non tender Skin. No rashes Musculoskeletal: no joint deformities     Data Reviewed: I have personally reviewed following labs and imaging studies  CBC: Recent Labs  Lab 05/19/20 1500 05/20/20 0256 05/21/20 0519 05/22/20 0354  WBC 13.8* 12.6* 16.8* 17.5*  NEUTROABS 12.6* 11.5* 14.3* 14.0*  HGB 13.5 12.8* 11.8* 13.1  HCT 41.5 39.8 36.5* 40.8  MCV 78.6* 79.0* 79.3* 80.3  PLT 296 308 333 144   Basic Metabolic Panel: Recent Labs  Lab 05/19/20 1500 05/20/20 0256 05/21/20 0519 05/22/20 0354  NA 141 147* 144 145  K 3.5 4.8 3.3* 3.5  CL 102 106 104 102  CO2 27 28 29 29   GLUCOSE 132* 148* 180* 184*  BUN 28* 29* 32* 32*  CREATININE 1.67* 1.55* 1.42* 1.46*  CALCIUM 8.0* 8.4* 8.1* 8.4*  MG 2.8* 2.7* 2.7* 2.5*  PHOS 3.3 3.6 4.2 4.4   GFR: Estimated Creatinine Clearance: 67.4 mL/min (A) (by C-G formula based on SCr of 1.46 mg/dL (H)). Liver Function Tests: Recent Labs  Lab 05/19/20 1500 05/20/20 0256 05/21/20 0519 05/22/20 0354  AST 89* 93* 67* 230*  ALT 76* 83* 87* 295*  ALKPHOS 91 93 80 94  BILITOT 0.7 0.5 0.5 0.5  PROT 7.5 7.4 6.4* 7.1  ALBUMIN 3.5 3.2* 2.8* 3.2*   No results for input(s): LIPASE, AMYLASE in the last 168 hours. No results for input(s): AMMONIA in the last 168 hours. Coagulation Profile: No results for input(s): INR, PROTIME in the last 168 hours. Cardiac Enzymes: No results for input(s): CKTOTAL, CKMB, CKMBINDEX, TROPONINI in the last 168 hours. BNP (last 3 results) No results for input(s):  PROBNP in the last 8760 hours. HbA1C: No results for input(s): HGBA1C in the last 72 hours. CBG: No results for input(s): GLUCAP in the last 168 hours. Lipid Profile: Recent Labs    05/19/20 1500  TRIG 83   Thyroid Function Tests: No results for input(s): TSH, T4TOTAL, FREET4, T3FREE, THYROIDAB in the last 72 hours. Anemia Panel: Recent Labs    05/21/20 0519 05/22/20 0354  FERRITIN 805* 651*      Radiology Studies: I have reviewed all of the imaging during this hospital visit personally     Scheduled Meds: . aMILoride  5 mg Oral Daily  . vitamin C  500 mg Oral Daily  . baricitinib  4 mg Oral Daily  . enoxaparin (LOVENOX) injection  40 mg Subcutaneous  Q24H  . fluticasone  2 spray Each Nare Daily  . loratadine  10 mg Oral Daily  . magnesium chloride  4 tablet Oral TID  . methylPREDNISolone (SOLU-MEDROL) injection  60 mg Intravenous Q12H  . omega-3 acid ethyl esters  1 capsule Oral BID  . pantoprazole  40 mg Oral Daily  . potassium chloride SA  60 mEq Oral TID  . sodium chloride flush  3 mL Intravenous Q12H  . zinc sulfate  220 mg Oral Daily   Continuous Infusions: . remdesivir 100 mg in NS 100 mL 100 mg (05/22/20 1035)     LOS: 3 days        Thoams Siefert Gerome Apley, MD

## 2020-05-23 LAB — COMPREHENSIVE METABOLIC PANEL
ALT: 193 U/L — ABNORMAL HIGH (ref 0–44)
AST: 64 U/L — ABNORMAL HIGH (ref 15–41)
Albumin: 2.7 g/dL — ABNORMAL LOW (ref 3.5–5.0)
Alkaline Phosphatase: 80 U/L (ref 38–126)
Anion gap: 13 (ref 5–15)
BUN: 31 mg/dL — ABNORMAL HIGH (ref 6–20)
CO2: 33 mmol/L — ABNORMAL HIGH (ref 22–32)
Calcium: 8.2 mg/dL — ABNORMAL LOW (ref 8.9–10.3)
Chloride: 98 mmol/L (ref 98–111)
Creatinine, Ser: 1.5 mg/dL — ABNORMAL HIGH (ref 0.61–1.24)
GFR calc Af Amer: >60 mL/min (ref >60)
GFR calc non Af Amer: 56 mL/min — ABNORMAL LOW (ref >60)
Glucose, Bld: 216 mg/dL — ABNORMAL HIGH (ref 70–99)
Potassium: 3.1 mmol/L — ABNORMAL LOW (ref 3.5–5.1)
Sodium: 144 mmol/L (ref 135–145)
Total Bilirubin: 0.4 mg/dL (ref 0.3–1.2)
Total Protein: 6.2 g/dL — ABNORMAL LOW (ref 6.5–8.1)

## 2020-05-23 LAB — FERRITIN: Ferritin: 335 ng/mL (ref 24–336)

## 2020-05-23 LAB — D-DIMER, QUANTITATIVE: D-Dimer, Quant: 3.37 ug/mL-FEU — ABNORMAL HIGH (ref 0.00–0.50)

## 2020-05-23 LAB — C-REACTIVE PROTEIN: CRP: 4.6 mg/dL — ABNORMAL HIGH (ref <1.0)

## 2020-05-23 MED ORDER — POTASSIUM CHLORIDE CRYS ER 20 MEQ PO TBCR
40.0000 meq | EXTENDED_RELEASE_TABLET | ORAL | Status: AC
  Administered 2020-05-23 (×2): 40 meq via ORAL
  Filled 2020-05-23 (×2): qty 2

## 2020-05-23 MED ORDER — METHYLPREDNISOLONE SODIUM SUCC 125 MG IJ SOLR
60.0000 mg | Freq: Every day | INTRAMUSCULAR | Status: DC
  Administered 2020-05-24 – 2020-05-25 (×3): 60 mg via INTRAVENOUS
  Filled 2020-05-23 (×2): qty 2

## 2020-05-23 NOTE — Progress Notes (Signed)
SATURATION QUALIFICATIONS: (This note is used to comply with regulatory documentation for home oxygen)  Patient Saturations on Room Air at Rest = 85%    Please briefly explain why patient needs home oxygen: To maintain a therapeutic oxygen level at home

## 2020-05-23 NOTE — Progress Notes (Signed)
Physical Therapy Treatment Patient Details Name: David Martin MRN: [id number] DOB: 1975/11/13 Today's Date: 05/23/2020    History of Present Illness 44 y.o. male with medical history significant of anxiety, hyperlipidemia, Bartter's syndrome,?  Hypertension (patient denies states he has whitecoat hypertension) who recently diagnosed with COVID-19 on 05/17/2020, had presented to Covid infusion clinic to receive monoclonal antibody however noted to have hypoxia with sats of 79 to 82% on room air    PT Comments    Pt feeling "tired" and "achy".  Assisted with amb full unit.  General Gait Details: SaO2 range 77% - 82% on 6L O2 walking.  VC's to decrease gait speed and increase deep purse lip breathing. Pt currently requires 4 lts at at rest and 6 lts with activity.   Follow Up Recommendations  No PT follow up     Equipment Recommendations  None recommended by PT    Recommendations for Other Services       Precautions / Restrictions Precautions Precaution Comments: monitor O2 sats Restrictions Weight Bearing Restrictions: No    Mobility  Bed Mobility Overal bed mobility: Modified Independent                Transfers Overall transfer level: Modified independent                  Ambulation/Gait Ambulation/Gait assistance: Modified independent (Device/Increase time) Gait Distance (Feet): 550 Feet (full unit) Assistive device: IV Pole Gait Pattern/deviations: Step-through pattern Gait velocity: too quick   General Gait Details: SaO2 range 77% - 82% on 6L O2 walking.  VC's to decrease gait speed and increase deep purse lip breathing.   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        Pertinent Vitals/Pain Pain Assessment: No/denies pain    Home Living                      Prior Function            PT Goals (current goals can now be found in the care plan section) Progress towards PT goals: Progressing toward goals    Frequency    Min 3X/week      PT Plan Current plan remains appropriate    Co-evaluation              AM-PAC PT "6 Clicks" Mobility   Outcome Measure  Help needed turning from your back to your side while in a flat bed without using bedrails?: None Help needed moving from lying on your back to sitting on the side of a flat bed without using bedrails?: None Help needed moving to and from  a bed to a chair (including a wheelchair)?: None Help needed standing up from a chair using your arms (e.g., wheelchair or bedside chair)?: None Help needed to walk in hospital room?: None Help needed climbing 3-5 steps with a railing? : None 6 Click Score: 24    End of Session Equipment Utilized During Treatment: Gait belt Activity Tolerance: Patient tolerated treatment well Patient left: in bed;with call bell/phone within reach Nurse Communication: Mobility status PT Visit Diagnosis: Pain;Difficulty in walking, not elsewhere classified (R26.2)     Time: 1040-1055 PT Time Calculation (min) (ACUTE ONLY): 15 min  Charges:  $Gait Training: 8-22 mins                     {Ellizabeth Dacruz  PTA Acute  Rehabilitation [employer]      [phone] Office      [phone]

## 2020-05-23 NOTE — Progress Notes (Signed)
OT Cancellation Note  Patient Details Name: [name] MRN: [id number] DOB: 10/19/1975   Cancelled Treatment:    Reason Eval/Treat Not Completed: Other (comment) Upon arrival patient ambulating in hallway with staff, checked in with patient once returned to room and requesting to rest. Will re-attempt as schedule allows.   Delbert Phenix OT OT pager: [phone]   Rosemary Holms 05/23/2020, 11:32 AM

## 2020-05-23 NOTE — Progress Notes (Signed)
PROGRESS NOTE    David Martin  ZHG:[id number] DOB: 01-Mar-1976 DOA: 05/19/2020 PCP: [name], MD    Brief Narrative:  Patient admitted to the hospital with working diagnosis of acute hypoxic respiratory failure due to SARS COVID-19 viral pneumonia.  44 year old male with past medical history for anxiety, dyslipidemia, hypertension and Bartter syndrome.  He was diagnosed with COVID-19 on 05/17/2020 his symptoms consistent with generalized fatigue, metallic smell and taste, and intermittent diarrhea for about a week prior to diagnosis.  On 05/19/20 he presented to the outpatient infusion clinic for monoclonal antibody treatment, he was noted to be hypoxemic down to 79 to 82% on room air, and he was placed on supplemental oxygen 4 L/min and he was referred to the hospital.  On his initial physical examination blood pressure 120/79, heart rate 86, respiratory 25, temperature 98.9, oxygen saturation 91% on 6 L/min per nasal cannula.  His lungs had coarse breath sounds bilaterally but no wheezing, heart S1-S2, present rhythm, soft abdomen, no lower extremity edema.  Chest radiograph with bilateral interstitial infiltrates, lower lobes and right upper lobe.  Patient continue to have significant hypoxemia on ambulation, oxygen saturation 82% on 6 L/min per [place] on ambulation.   Assessment & Plan:   Principal Problem:   Acute respiratory failure due to COVID-19 [facility]) Active Problems:   HTN (hypertension)   Bartters syndrome (HCC)   History of panic attacks   Hyperlipidemia   Anxiety   1.  Acute hypoxic respiratory failure due to SARS COVID-19 viral pneumonia.   RR: 18  Pulse oxymetry: 90  Fi02: 4 L/mim per [place]   COVID-19 Labs  Recent Labs    05/21/20 0519 05/22/20 0354 05/23/20 0412  DDIMER 3.97* 3.97* 3.37*  FERRITIN 805* 651* 335  CRP 11.3* 7.4* 4.6*    No results found for: SARSCOV2NAA  Today with more fatigue than yesterday, continue to need increased oxygen flow  with exertion up to 6 L/min. Inflammatory markers are trending down.  Today will complete Remdesivir #5/5, continue with Baricitinib and systemic corticosteroids (decreased to methylprednisolone 60 mg IV q24 H).  On bronchodilators, antitussive agents and airway clearing techniques. Continue to encourage out of bed to chair. Plan to discharge home once improvement in oxygen requirements, 5 or less on ambulation.   2. CKD stage 2 with Hypokalemia. (base cr 1,4) Stable renal function with serum cr at 1,50 with K down to 3,1 and plasma bicarbonate at 33. Add 40 meq Kcl x2 and follow up on renal function in am. Patient is tolerating po well. His Mag has been above 2.   3. HTN. On amiloride with good blood pressure control.   4. Anxiety. On alprazolam, no confusion or agitation.     Status is: Inpatient  Remains inpatient appropriate because:IV treatments appropriate due to intensity of illness or inability to take PO   Dispo:  Patient From: Home  Planned Disposition: Home  Expected discharge date: 05/25/20  Medically stable for discharge: No   DVT prophylaxis:  enoxaparin   Code Status:   full  Family Communication:  I spoke over the phone with the patient's wife about patient's  condition, plan of care, prognosis and all questions were addressed.    Subjective: Patient is more fatigue today, continue to have worsening dyspnea on exertion, no nausea or vomiting.   Objective: Vitals:   05/22/20 0442 05/22/20 1820 05/22/20 2013 05/23/20 0628  BP: 122/76 124/74 126/76 120/74  Pulse: (!) 45 64 (!) 52 64  Resp: 20 18 18 18   Temp: 98.5 F (36.9 C) 99.8 F (37.7 C) 99 F (37.2 C) 98.5 F (36.9 C)  TempSrc:  Oral    SpO2: 99% 97% 96% 90%  Weight:      Height:        Intake/Output Summary (Last 24 hours) at 05/23/2020 0837 Last data filed at 05/23/2020 1607 Gross per 24 hour  Intake --  Output 2000 ml  Net -2000 ml   Filed Weights   05/19/20 1900  Weight: 75.6 kg     Examination:   General: Not in pain,. Deconditioned  Neurology: Awake and alert, non focal  E ENT: no pallor, no icterus, oral mucosa moist Cardiovascular: No JVD. S1-S2 present, rhythmic, no gallops, rubs, or murmurs. No lower extremity edema. Pulmonary: positive breath sounds bilaterally, with no wheezing, rhonchi or rales. Gastrointestinal. Abdomen soft and non tender Skin. No rashes Musculoskeletal: no joint deformities     Data Reviewed: I have personally reviewed following labs and imaging studies  CBC: Recent Labs  Lab 05/19/20 1500 05/20/20 0256 05/21/20 0519 05/22/20 0354  WBC 13.8* 12.6* 16.8* 17.5*  NEUTROABS 12.6* 11.5* 14.3* 14.0*  HGB 13.5 12.8* 11.8* 13.1  HCT 41.5 39.8 36.5* 40.8  MCV 78.6* 79.0* 79.3* 80.3  PLT 296 308 333 371   Basic Metabolic Panel: Recent Labs  Lab 05/19/20 1500 05/20/20 0256 05/21/20 0519 05/22/20 0354 05/23/20 0412  NA 141 147* 144 145 144  K 3.5 4.8 3.3* 3.5 3.1*  CL 102 106 104 102 98  CO2 27 28 29 29  33*  GLUCOSE 132* 148* 180* 184* 216*  BUN 28* 29* 32* 32* 31*  CREATININE 1.67* 1.55* 1.42* 1.46* 1.50*  CALCIUM 8.0* 8.4* 8.1* 8.4* 8.2*  MG 2.8* 2.7* 2.7* 2.5*  --   PHOS 3.3 3.6 4.2 4.4  --    GFR: Estimated Creatinine Clearance: 65.6 mL/min (A) (by C-G formula based on SCr of 1.5 mg/dL (H)). Liver Function Tests: Recent Labs  Lab 05/19/20 1500 05/20/20 0256 05/21/20 0519 05/22/20 0354 05/23/20 0412  AST 89* 93* 67* 230* 64*  ALT 76* 83* 87* 295* 193*  ALKPHOS 91 93 80 94 80  BILITOT 0.7 0.5 0.5 0.5 0.4  PROT 7.5 7.4 6.4* 7.1 6.2*  ALBUMIN 3.5 3.2* 2.8* 3.2* 2.7*   No results for input(s): LIPASE, AMYLASE in the last 168 hours. No results for input(s): AMMONIA in the last 168 hours. Coagulation Profile: No results for input(s): INR, PROTIME in the last 168 hours. Cardiac Enzymes: No results for input(s): CKTOTAL, CKMB, CKMBINDEX, TROPONINI in the last 168 hours. BNP (last 3 results) No results for  input(s): PROBNP in the last 8760 hours. HbA1C: No results for input(s): HGBA1C in the last 72 hours. CBG: No results for input(s): GLUCAP in the last 168 hours. Lipid Profile: No results for input(s): CHOL, HDL, LDLCALC, TRIG, CHOLHDL, LDLDIRECT in the last 72 hours. Thyroid Function Tests: No results for input(s): TSH, T4TOTAL, FREET4, T3FREE, THYROIDAB in the last 72 hours. Anemia Panel: Recent Labs    05/22/20 0354 05/23/20 0412  FERRITIN 651* 335      Radiology Studies: I have reviewed all of the imaging during this hospital visit personally     Scheduled Meds: . aMILoride  5 mg Oral Daily  . vitamin C  500 mg Oral Daily  . baricitinib  4 mg Oral Daily  . chlorpheniramine-HYDROcodone  5 mL Oral Q12H  . enoxaparin (LOVENOX) injection  40 mg Subcutaneous Q24H  .  fluticasone  2 spray Each Nare Daily  . Ipratropium-Albuterol  1 puff Inhalation Q6H  . loratadine  10 mg Oral Daily  . magnesium chloride  4 tablet Oral TID  . methylPREDNISolone (SOLU-MEDROL) injection  60 mg Intravenous Q12H  . omega-3 acid ethyl esters  1 capsule Oral BID  . pantoprazole  40 mg Oral Daily  . sodium chloride flush  3 mL Intravenous Q12H  . zinc sulfate  220 mg Oral Daily   Continuous Infusions: . remdesivir 100 mg in NS 100 mL 100 mg (05/23/20 0836)     LOS: 4 days        Airel Magadan Gerome Apley, MD

## 2020-05-24 ENCOUNTER — Encounter (HOSPITAL_COMMUNITY): Payer: 59

## 2020-05-24 LAB — COMPREHENSIVE METABOLIC PANEL
ALT: 181 U/L — ABNORMAL HIGH (ref 0–44)
AST: 54 U/L — ABNORMAL HIGH (ref 15–41)
Albumin: 2.8 g/dL — ABNORMAL LOW (ref 3.5–5.0)
Alkaline Phosphatase: 71 U/L (ref 38–126)
Anion gap: 9 (ref 5–15)
BUN: 29 mg/dL — ABNORMAL HIGH (ref 6–20)
CO2: 32 mmol/L (ref 22–32)
Calcium: 8.3 mg/dL — ABNORMAL LOW (ref 8.9–10.3)
Chloride: 96 mmol/L — ABNORMAL LOW (ref 98–111)
Creatinine, Ser: 1.56 mg/dL — ABNORMAL HIGH (ref 0.61–1.24)
GFR calc Af Amer: >60 mL/min (ref >60)
GFR calc non Af Amer: 54 mL/min — ABNORMAL LOW (ref >60)
Glucose, Bld: 113 mg/dL — ABNORMAL HIGH (ref 70–99)
Potassium: 3.8 mmol/L (ref 3.5–5.1)
Sodium: 137 mmol/L (ref 135–145)
Total Bilirubin: 0.7 mg/dL (ref 0.3–1.2)
Total Protein: 6 g/dL — ABNORMAL LOW (ref 6.5–8.1)

## 2020-05-24 LAB — FERRITIN: Ferritin: 314 ng/mL (ref 24–336)

## 2020-05-24 LAB — C-REACTIVE PROTEIN: CRP: 2.5 mg/dL — ABNORMAL HIGH (ref <1.0)

## 2020-05-24 LAB — D-DIMER, QUANTITATIVE: D-Dimer, Quant: 3.41 ug/mL-FEU — ABNORMAL HIGH (ref 0.00–0.50)

## 2020-05-24 NOTE — TOC Transition Note (Signed)
Transition of Care [facility]) - CM/SW Discharge Note   Patient Details  Name: CLENNON NASCA MRN: [id number] Date of Birth: 04/01/1976  [facility] CM/SW Contact:  Leeroy Cha, RN Phone Number: 05/24/2020, 9:49 AM   Clinical Narrative:    Home on 02-referral to [facility] at 0948-Zack Blank notified.    Final next level of care: [place] Barriers to Discharge: Barriers Resolved   Patient Goals and CMS Choice Patient states their goals for this hospitalization and ongoing recovery are:: to go home CMS Medicare.gov Compare Post Acute Care list provided to:: Patient Choice offered to / list presented to : Patient  Discharge Placement                       Discharge Plan and Services   Discharge Planning Services: CM Consult Post Acute Care Choice: Durable Medical Equipment          DME Arranged: Oxygen DME Agency: [facility] Date DME Agency Contacted: 05/24/20 Time DME Agency Contacted: [phone] Representative spoke with at DME Agency: [place] Determinants of Health ([place]) Interventions     Readmission Risk Interventions No flowsheet data found.

## 2020-05-24 NOTE — Consult Note (Signed)
   [facility] Inpatient Consult   05/24/2020  David Martin 1976/07/17 [id number]    [place] Organization [ACO] Patient:  [place] plan  Patient is currently in pending status with [place] Management for post hospital follow up by a [place] Coordinator for the Economy.  Our community based plan of care will focused on disease management and community resource support.    Patient will receive a post hospital call and will be evaluated for assessments and disease process education.      Plan: Patient will be followed by [place] Coordinator.   For additional questions or referrals please contact:   Natividad Brood, RN BSN [facility] Liaison  [phone] business mobile phone Toll free office [phone]  Fax number: [phone] [email or web address]

## 2020-05-24 NOTE — Progress Notes (Signed)
Physical Therapy Treatment Patient Details Name: David Martin MRN: [id number] DOB: 08-31-1976 Today's Date: 05/24/2020    History of Present Illness 44 y.o. male with medical history significant of anxiety, hyperlipidemia, Bartter's syndrome,?  Hypertension (patient denies states he has whitecoat hypertension) who recently diagnosed with COVID-19 on 05/17/2020, had presented to Covid infusion clinic to receive monoclonal antibody however noted to have hypoxia with sats of 79 to 82% on room air    PT Comments    Assisted with amb pt around full unit.  Pt feeling Better but "it's slow coming".  General Gait Details: requires 6 lts to achieve sats > 88% with activity.  Tolerated amb full unit.  No standing rest break needed this session.  Follow Up Recommendations  No PT follow up     Equipment Recommendations  None recommended by PT    Recommendations for Other Services       Precautions / Restrictions Precautions Precaution Comments: monitor O2 sats Restrictions Weight Bearing Restrictions: No    Mobility  Bed Mobility Overal bed mobility: Modified Independent                Transfers Overall transfer level: Modified independent                  Ambulation/Gait Ambulation/Gait assistance: Modified independent (Device/Increase time) Gait Distance (Feet): 550 Feet Assistive device: IV Pole Gait Pattern/deviations: Step-through pattern     General Gait Details: requires 6 lts to achieve sats > 88% with activity.  Tolerated amb full unit.  No standing rest break needed this session.   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        Pertinent Vitals/Pain Pain Assessment: No/denies pain    Home Living Family/patient expects to be discharged to:: Private residence Living Arrangements: Spouse/significant other           Home Equipment: None      Prior Function Level of Independence: Independent      Comments: works as Statistician at [employer] (current goals can now be found in the care plan section) Acute Rehab PT Goals Patient Stated Goal: working out at gym Progress towards PT goals: Progressing toward goals    Frequency    Min 3X/week      PT Plan Current plan remains appropriate    Co-evaluation              AM-PAC PT "6 Clicks" Mobility   Outcome Measure  Help needed turning from your back to your side while in a flat bed without using bedrails?: None Help needed  moving from lying on your back to sitting on the side of a flat bed without using bedrails?: None Help needed moving to and from a bed to a chair (including a wheelchair)?: None Help needed standing up from a chair using your arms (e.g., wheelchair or bedside chair)?: None Help needed to walk in hospital room?: None Help needed climbing 3-5 steps with a railing? : None 6 Click Score: 24    End of Session Equipment Utilized During Treatment: Gait belt Activity Tolerance: Patient tolerated treatment well Patient left: in chair;with call bell/phone within reach Nurse Communication: Mobility status PT Visit Diagnosis: Pain;Difficulty in walking, not elsewhere classified (R26.2)     Time: 1125-1150 PT Time Calculation (min) (ACUTE ONLY): 25 min  Charges:  $Gait Training: 8-22 mins $Therapeutic Activity: 8-22 mins                     Rica Koyanagi  PTA Acute  Rehabilitation  Services Pager      [phone] Office      [phone]

## 2020-05-24 NOTE — Progress Notes (Signed)
PROGRESS NOTE    David Martin  JJO:[id number] DOB: April 02, 1976 DOA: 05/19/2020 PCP: [name], MD    Brief Narrative:  Patient admitted to the hospital with working diagnosis of acute hypoxic respiratory failure due to SARS COVID-19 viral pneumonia.  44 year old male with past medical history for anxiety, dyslipidemia, hypertension and Bartter syndrome. He was diagnosed with COVID-19 on9/17/2021 his symptoms consistent with generalized fatigue, metallic smell and taste, and intermittent diarrhea for about a week prior to diagnosis. On 9/19/21he presented to the outpatient infusion clinic for monoclonal antibody treatment, he was noted to be hypoxemic down to 79 to 82% on room air, and he was placed on supplemental oxygen 4 L/min and he was referred to the hospital.On his initial physical examination blood pressure 120/79, heart rate 86, respiratory 25, temperature 98.9, oxygen saturation 91% on 6 L/min per nasal cannula. His lungs had coarse breath sounds bilaterally but no wheezing, heart S1-S2, present rhythm, soft abdomen, no lower extremity edema.  Chest radiograph with bilateral interstitial infiltrates, lower lobes and right upper lobe.  [place] lower extremities 05/20/20 negative for DVT. CT chest on 05/20/20 negative for pulmonary embolism. Diffuse bilateral ground glass opacities.   Patient continue to have significant hypoxemia on ambulation, oxygen saturation 82% on 6 L/min per [place] on ambulation.    Assessment & Plan:   Principal Problem:   Acute respiratory failure due to COVID-19 [facility]) Active Problems:   HTN (hypertension)   Bartters syndrome (HCC)   History of panic attacks   Hyperlipidemia   Anxiety   1.Acute hypoxic respiratory failure due to SARS COVID-19 viral pneumonia. Sp remdesivir #5/5  RR: 20  Pulse oxymetry: 92%  Fi02: 4 L/min per [place]  COVID-19 Labs  Recent Labs    05/22/20 0354 05/23/20 0412 05/24/20 0351  DDIMER 3.97* 3.37* 3.41*    FERRITIN 651* 335 314  CRP 7.4* 4.6* 2.5*    No results found for: SARSCOV2NAA  Inflammatory markers are trending down D dimer continue to be elevated but less than 4. Venous thromboembolism work up has been negative.   Continue medical therapy with systemic steroids and baricitinib. On bronchodilator therapy, airway clearing techniques, out of bed to chair and PT/OT.   Continue to need 6L per min per [place] on ambulation,likely due to extensive lung injury due to COVID 19 viral infection.    2. CKD stage 2 with Hypokalemia. (base cr 1,4) Renal function with serum cr at 1,56 with K at 3,8 and bicarbonate at 32. Continue close follow up on renal function and electrolytes. Patient is tolerating po well.   3. HTN. continue with amiloride for blood pressure control.   4. Anxiety. Continue with alprazolam, with good toleration.    Status is: Inpatient  Remains inpatient appropriate because:IV treatments appropriate due to intensity of illness or inability to take PO   Dispo:  Patient From: Home  Planned Disposition: Home  Expected discharge date:   Medically stable for discharge: No   DVT prophylaxis: Enoxaparin   Code Status:   full  Family Communication:  I spoke over the phone with the patient's wife about patient's  condition, plan of care, prognosis and all questions were addressed.      Subjective: Patient continue to have dyspnea on exertion, no nausea or vomiting, no chest pain. Continue to use up to 6 L per min per [place]  on exertion.   Objective: Vitals:   05/23/20 0628 05/23/20 1417 05/23/20 1933 05/24/20 0522  BP: 120/74 123/72 126/75 115/80  Pulse: 64 84 79 (!) 59  Resp: 18 19 18 20   Temp: 98.5 F (36.9 C) 98.8 F (37.1 C) 99.2 F (37.3 C) 99 F (37.2 C)  TempSrc:      SpO2: 90% 94% 90% 92%  Weight:      Height:        Intake/Output Summary (Last 24 hours) at 05/24/2020 1005 Last data filed at 05/24/2020 0900 Gross per 24 hour  Intake 600 ml  Output  1950 ml  Net -1350 ml   Filed Weights   05/19/20 1900  Weight: 75.6 kg    Examination:   General: Not in pain, positive dyspnea at rest. Deconditioned  Neurology: Awake and alert, non focal  E ENT: mild pallor, no icterus, oral mucosa moist Cardiovascular: No JVD. S1-S2 present, rhythmic, no gallops, rubs, or murmurs. No lower extremity edema. Pulmonary: positive breath sounds bilaterally, no wheezing Gastrointestinal. Abdomen soft and non tender Skin. No rashes Musculoskeletal: no joint deformities     Data Reviewed: I have personally reviewed following labs and imaging studies  CBC: Recent Labs  Lab 05/19/20 1500 05/20/20 0256 05/21/20 0519 05/22/20 0354  WBC 13.8* 12.6* 16.8* 17.5*  NEUTROABS 12.6* 11.5* 14.3* 14.0*  HGB 13.5 12.8* 11.8* 13.1  HCT 41.5 39.8 36.5* 40.8  MCV 78.6* 79.0* 79.3* 80.3  PLT 296 308 333 696   Basic Metabolic Panel: Recent Labs  Lab 05/19/20 1500 05/19/20 1500 05/20/20 0256 05/21/20 0519 05/22/20 0354 05/23/20 0412 05/24/20 0351  NA 141   < > 147* 144 145 144 137  K 3.5   < > 4.8 3.3* 3.5 3.1* 3.8  CL 102   < > 106 104 102 98 96*  CO2 27   < > 28 29 29  33* 32  GLUCOSE 132*   < > 148* 180* 184* 216* 113*  BUN 28*   < > 29* 32* 32* 31* 29*  CREATININE 1.67*   < > 1.55* 1.42* 1.46* 1.50* 1.56*  CALCIUM 8.0*   < > 8.4* 8.1* 8.4* 8.2* 8.3*  MG 2.8*  --  2.7* 2.7* 2.5*  --   --   PHOS 3.3  --  3.6 4.2 4.4  --   --    < > = values in this interval not displayed.   GFR: Estimated Creatinine Clearance: 63 mL/min (A) (by C-G formula based on SCr of 1.56 mg/dL (H)). Liver Function Tests: Recent Labs  Lab 05/20/20 0256 05/21/20 0519 05/22/20 0354 05/23/20 0412 05/24/20 0351  AST 93* 67* 230* 64* 54*  ALT 83* 87* 295* 193* 181*  ALKPHOS 93 80 94 80 71  BILITOT 0.5 0.5 0.5 0.4 0.7  PROT 7.4 6.4* 7.1 6.2* 6.0*  ALBUMIN 3.2* 2.8* 3.2* 2.7* 2.8*   No results for input(s): LIPASE, AMYLASE in the last 168 hours. No results for  input(s): AMMONIA in the last 168 hours. Coagulation Profile: No results for input(s): INR, PROTIME in the last 168 hours. Cardiac Enzymes: No results for input(s): CKTOTAL, CKMB, CKMBINDEX, TROPONINI in the last 168 hours. BNP (last 3 results) No results for input(s): PROBNP in the last 8760 hours. HbA1C: No results for input(s): HGBA1C in the last 72 hours. CBG: No results for input(s): GLUCAP in the last 168 hours. Lipid Profile: No results for input(s): CHOL, HDL, LDLCALC, TRIG, CHOLHDL, LDLDIRECT in the last 72 hours. Thyroid Function Tests: No results for input(s): TSH, T4TOTAL, FREET4, T3FREE, THYROIDAB in the last 72 hours. Anemia Panel: Recent Labs    05/23/20  0412 05/24/20 0351  FERRITIN 335 314      Radiology Studies: I have reviewed all of the imaging during this hospital visit personally     Scheduled Meds: . aMILoride  5 mg Oral Daily  . vitamin C  500 mg Oral Daily  . baricitinib  4 mg Oral Daily  . chlorpheniramine-HYDROcodone  5 mL Oral Q12H  . enoxaparin (LOVENOX) injection  40 mg Subcutaneous Q24H  . fluticasone  2 spray Each Nare Daily  . Ipratropium-Albuterol  1 puff Inhalation Q6H  . loratadine  10 mg Oral Daily  . magnesium chloride  4 tablet Oral TID  . methylPREDNISolone (SOLU-MEDROL) injection  60 mg Intravenous Daily  . omega-3 acid ethyl esters  1 capsule Oral BID  . pantoprazole  40 mg Oral Daily  . sodium chloride flush  3 mL Intravenous Q12H  . zinc sulfate  220 mg Oral Daily   Continuous Infusions:   LOS: 5 days        Nahjae Hoeg Gerome Apley, MD

## 2020-05-24 NOTE — Evaluation (Signed)
Occupational Therapy Evaluation Patient Details Name: David Martin MRN: [id number] DOB: 19-Feb-1976 Today's Date: 05/24/2020    History of Present Illness 44 y.o. male with medical history significant of anxiety, hyperlipidemia, Bartter's syndrome,?  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 Patient reporting feeling cold and having anxiety. Patient demonstrates ability to perform functional mobility, donn socks, ambulate to sink and perform grooming task without physical assistance. Patient able to only stand approx 2 min at sink before needing to quickly sit at edge of bed. O2 sats 85% on 4 liters. Patient provided verbal cues for pursed lipped and diaphragmatic breathing as patient exhibiting tachypnea. Therapist also discussed the need for use of incentive spirometer and proning. Patient reports he's been compliant. Patient demonstrates poor activity tolerance and decreased cardiopulmonary endurance and will benefit from skilled OT services while in hospital. Unlikely that patient will need OT services at discharge.    Follow Up Recommendations  No OT follow up    Equipment Recommendations  None recommended by OT    Recommendations for Other Services       Precautions / Restrictions Precautions Precaution Comments: monitor O2 sats Restrictions Weight Bearing Restrictions: No      Mobility Bed Mobility Overal bed mobility: Modified Independent                Transfers Overall transfer level: Modified independent                    Balance Overall balance assessment: No apparent balance deficits (not formally  assessed)                                         ADL either performed or assessed with clinical judgement   ADL Overall ADL's : Needs assistance/impaired Eating/Feeding: Independent   Grooming: Standing;Oral care Grooming Details (indicate cue type and reason): Able to stand at sink approx 2 minutes before needing to sit and have a rest break. Upper Body Bathing: Set up;Sitting   Lower Body Bathing: Set up;Sit to/from stand   Upper Body Dressing : Set up;Sitting   Lower Body Dressing: Set up;Sit to/from stand Lower Body Dressing Details (indicate cue type and reason): donned socks in bed Toilet Transfer: Supervision/safety;Regular Toilet   Toileting- Clothing Manipulation and Hygiene: Sit to/from stand;Supervision/safety               Vision   Vision Assessment?: No apparent visual deficits     Perception     Praxis      Pertinent Vitals/Pain Pain Assessment: No/denies pain     Hand Dominance     Extremity/Trunk Assessment Upper Extremity Assessment Upper Extremity Assessment: RUE deficits/detail;LUE deficits/detail RUE Deficits / Details: WNL ROM and strength LUE Deficits / Details: WNL ROM and strength           Communication Communication Communication: No difficulties   Cognition Arousal/Alertness: Awake/alert Behavior During Therapy: WFL for tasks assessed/performed Overall Cognitive Status: Within Functional Limits for tasks assessed  General Comments       Exercises     Shoulder Instructions      Home Living Family/patient expects to be discharged to:: Private residence Living Arrangements: Spouse/significant other                           Home Equipment: None          Prior Functioning/Environment Level of Independence: Independent        Comments: works as Statistician at [employer]        OT Problem List: Decreased activity  tolerance;Cardiopulmonary status limiting activity      OT Treatment/Interventions: Self-care/ADL training;Therapeutic exercise;Energy conservation;Therapeutic activities;Patient/family education    OT Goals(Current goals can be found in the care plan section) Acute Rehab OT Goals Patient Stated Goal: working out at gym OT Goal Formulation: With patient Time For Goal Achievement: 06/07/20 Potential to Achieve Goals: Good  OT Frequency: Min 2X/week   Barriers to D/C:            Co-evaluation              AM-PAC OT "6 Clicks" Daily Activity     Outcome Measure Help from another person eating meals?: None Help from another person taking care of personal grooming?: A Little Help from another person toileting, which includes using toliet, bedpan, or urinal?: A Little Help from another person bathing (including washing, rinsing, drying)?: A Little Help from another person to put on and taking off regular upper body clothing?: A Little Help from another person to put on and taking off regular lower body clothing?: A Little 6 Click Score: 19   End of Session Equipment Utilized During Treatment: Oxygen Nurse Communication: Mobility status  Activity Tolerance: Patient limited by fatigue (anxiety) Patient left:    OT Visit Diagnosis: Muscle weakness (generalized) (M62.81)                Time: 1610-9604 OT Time Calculation (min): 19 min Charges:  OT General Charges $OT Visit: 1 Visit OT Evaluation $OT Eval Low Complexity: 1 Low  Ethleen Lormand, OTR/L [place]  Office [phone] Pager: [phone]   Lenward Chancellor 05/24/2020, 1:42 PM

## 2020-05-25 LAB — COMPREHENSIVE METABOLIC PANEL
ALT: 142 U/L — ABNORMAL HIGH (ref 0–44)
AST: 27 U/L (ref 15–41)
Albumin: 3.2 g/dL — ABNORMAL LOW (ref 3.5–5.0)
Alkaline Phosphatase: 78 U/L (ref 38–126)
Anion gap: 12 (ref 5–15)
BUN: 39 mg/dL — ABNORMAL HIGH (ref 6–20)
CO2: 34 mmol/L — ABNORMAL HIGH (ref 22–32)
Calcium: 9.3 mg/dL (ref 8.9–10.3)
Chloride: 93 mmol/L — ABNORMAL LOW (ref 98–111)
Creatinine, Ser: 1.5 mg/dL — ABNORMAL HIGH (ref 0.61–1.24)
GFR calc Af Amer: >60 mL/min (ref >60)
GFR calc non Af Amer: 56 mL/min — ABNORMAL LOW (ref >60)
Glucose, Bld: 139 mg/dL — ABNORMAL HIGH (ref 70–99)
Potassium: 4.7 mmol/L (ref 3.5–5.1)
Sodium: 139 mmol/L (ref 135–145)
Total Bilirubin: 0.4 mg/dL (ref 0.3–1.2)
Total Protein: 6.9 g/dL (ref 6.5–8.1)

## 2020-05-25 LAB — C-REACTIVE PROTEIN: CRP: 3.7 mg/dL — ABNORMAL HIGH (ref <1.0)

## 2020-05-25 LAB — FERRITIN: Ferritin: 380 ng/mL — ABNORMAL HIGH (ref 24–336)

## 2020-05-25 LAB — CULTURE, BLOOD (ROUTINE X 2)
Culture: NO GROWTH
Culture: NO GROWTH
Special Requests: ADEQUATE
Special Requests: ADEQUATE

## 2020-05-25 LAB — D-DIMER, QUANTITATIVE: D-Dimer, Quant: 2.67 ug/mL-FEU — ABNORMAL HIGH (ref 0.00–0.50)

## 2020-05-25 NOTE — Progress Notes (Signed)
PROGRESS NOTE    David Martin  QHU:[id number] DOB: [birth year] DOA: 05/19/2020 PCP: [name], MD    Brief Narrative:  Patient admitted to the hospital with working diagnosis of acute hypoxic respiratory failure due to SARS COVID-19 viral pneumonia.  44 year old male with past medical history for anxiety, dyslipidemia, hypertension and Bartter syndrome. He was diagnosed with COVID-19 on9/17/2021 his symptoms consistent with generalized fatigue, metallic smell and taste, and intermittent diarrhea for about a week prior to diagnosis. On 9/19/21he presented to the outpatient infusion clinic for monoclonal antibody treatment, he was noted to be hypoxemic down to 79 to 82% on room air, and he was placed on supplemental oxygen 4 L/min and he was referred to the hospital.On his initial physical examination blood pressure 120/79, heart rate 86, respiratory 25, temperature 98.9, oxygen saturation 91% on 6 L/min per nasal cannula. His lungs had coarse breath sounds bilaterally but no wheezing, heart S1-S2, present rhythm, soft abdomen, no lower extremity edema.  Chest radiograph with bilateral interstitial infiltrates, lower lobes and right upper lobe.  [place] lower extremities 05/20/20 negative for DVT. CT chest on 05/20/20 negative for pulmonary embolism. Diffuse bilateral ground glass opacities.   Patient continue to have significant hypoxemia on ambulation, oxygen saturation 82% on 6 L/min per [place] on ambulation   Assessment & Plan:   Principal Problem:   Acute respiratory failure due to COVID-19 [facility]) Active Problems:   HTN (hypertension)   Bartters syndrome (HCC)   History of panic attacks   Hyperlipidemia   Anxiety   1.Acute hypoxic respiratory failure due to SARS COVID-19 viral pneumonia/ with extensive lug injury. Sp remdesivir #5/5  RR: 20  Pulse oxymetry: 93 to 94%  Fi02: 4 L/min per [place]   COVID-19 Labs  Recent Labs    05/23/20 0412 05/24/20 0351  05/25/20 0507  DDIMER 3.37* 3.41* 2.67*  FERRITIN 335 314 380*  CRP 4.6* 2.5* 3.7*    No results found for: [place]  Patient feeling better, but continue to have dyspnea on exertion and need of supplemental 02 per . D dimer is trending down,   Medical therapy with baricitinib and methylprednisolone. Considering extensive lung injury evidence in CT chest, patient will need a slow taper prednisone at discharge and outpatient follow up with pulmonary post [place] clinic.  Continue with bronchodilator therapy, airway clearing techniques, out of bed to chair and PT/OT.   Plan for possible dc home in am, if continue to improve inflammatory markers.   2. CKD stage 2 with hypokalemia. Stable renal function with serum cr at 1,50 with K at 4,7 and serum bicarbonate at 34.  3. HTN.Blood pressure controlled withamiloride.   4. Anxiety.On alprazolam.     Status is: Inpatient  Remains inpatient appropriate because:Inpatient level of care appropriate due to severity of illness   Dispo:  Patient From: Home  Planned Disposition: Home  Expected discharge date: 05/26/20  Medically stable for discharge: No    DVT prophylaxis: Enoxaparin   Code Status:   full  Family Communication:   left message to his wife phone number   Subjective: Patient is feeling better but not yet back to baseline, he continue to have dyspnea on exertion, no nausea or vomiting and tolerating po well.   Objective: Vitals:   05/23/20 1933 05/24/20 0522 05/24/20 1356 05/24/20 2058  BP: 126/75 115/80 115/71 117/77  Pulse: 79 (!) 59 77 82  Resp: 18 20 20 20   Temp: 99.2 F (37.3 C) 99 F (37.2 C)  99.2 F (37.3 C) 98.6 F (37 C)  TempSrc:    Oral  SpO2: 90% 92% 93% 94%  Weight:      Height:        Intake/Output Summary (Last 24 hours) at 05/25/2020 0903 Last data filed at 05/24/2020 2022 Gross per 24 hour  Intake 240 ml  Output 600 ml  Net -360 ml   Filed Weights   05/19/20 1900  Weight:  75.6 kg    Examination:   General: Not in pain or dyspnea. Deconditioned  Neurology: Awake and alert, non focal  E ENT: no pallor, no icterus, oral mucosa moist Cardiovascular: No JVD. S1-S2 present, rhythmic, no gallops, rubs, or murmurs. No lower extremity edema. Pulmonary:  positive breath sounds bilaterally, no wheezing, rhonchi or rales. Gastrointestinal. Abdomen soft and non tender. Skin. No rashes Musculoskeletal: no joint deformities     Data Reviewed: I have personally reviewed following labs and imaging studies  CBC: Recent Labs  Lab 05/19/20 1500 05/20/20 0256 05/21/20 0519 05/22/20 0354  WBC 13.8* 12.6* 16.8* 17.5*  NEUTROABS 12.6* 11.5* 14.3* 14.0*  HGB 13.5 12.8* 11.8* 13.1  HCT 41.5 39.8 36.5* 40.8  MCV 78.6* 79.0* 79.3* 80.3  PLT 296 308 333 798   Basic Metabolic Panel: Recent Labs  Lab 05/19/20 1500 05/19/20 1500 05/20/20 0256 05/20/20 0256 05/21/20 0519 05/22/20 0354 05/23/20 0412 05/24/20 0351 05/25/20 0507  NA 141   < > 147*   < > 144 145 144 137 139  K 3.5   < > 4.8   < > 3.3* 3.5 3.1* 3.8 4.7  CL 102   < > 106   < > 104 102 98 96* 93*  CO2 27   < > 28   < > 29 29 33* 32 34*  GLUCOSE 132*   < > 148*   < > 180* 184* 216* 113* 139*  BUN 28*   < > 29*   < > 32* 32* 31* 29* 39*  CREATININE 1.67*   < > 1.55*   < > 1.42* 1.46* 1.50* 1.56* 1.50*  CALCIUM 8.0*   < > 8.4*   < > 8.1* 8.4* 8.2* 8.3* 9.3  MG 2.8*  --  2.7*  --  2.7* 2.5*  --   --   --   PHOS 3.3  --  3.6  --  4.2 4.4  --   --   --    < > = values in this interval not displayed.   GFR: Estimated Creatinine Clearance: 65.6 mL/min (A) (by C-G formula based on SCr of 1.5 mg/dL (H)). Liver Function Tests: Recent Labs  Lab 05/21/20 0519 05/22/20 0354 05/23/20 0412 05/24/20 0351 05/25/20 0507  AST 67* 230* 64* 54* 27  ALT 87* 295* 193* 181* 142*  ALKPHOS 80 94 80 71 78  BILITOT 0.5 0.5 0.4 0.7 0.4  PROT 6.4* 7.1 6.2* 6.0* 6.9  ALBUMIN 2.8* 3.2* 2.7* 2.8* 3.2*   No results  for input(s): LIPASE, AMYLASE in the last 168 hours. No results for input(s): AMMONIA in the last 168 hours. Coagulation Profile: No results for input(s): INR, PROTIME in the last 168 hours. Cardiac Enzymes: No results for input(s): CKTOTAL, CKMB, CKMBINDEX, TROPONINI in the last 168 hours. BNP (last 3 results) No results for input(s): PROBNP in the last 8760 hours. HbA1C: No results for input(s): HGBA1C in the last 72 hours. CBG: No results for input(s): GLUCAP in the last 168 hours. Lipid Profile: No results for input(s):  CHOL, HDL, LDLCALC, TRIG, CHOLHDL, LDLDIRECT in the last 72 hours. Thyroid Function Tests: No results for input(s): TSH, T4TOTAL, FREET4, T3FREE, THYROIDAB in the last 72 hours. Anemia Panel: Recent Labs    05/24/20 0351 05/25/20 0507  FERRITIN 314 380*      Radiology Studies: I have reviewed all of the imaging during this hospital visit personally     Scheduled Meds: . aMILoride  5 mg Oral Daily  . vitamin C  500 mg Oral Daily  . baricitinib  4 mg Oral Daily  . chlorpheniramine-HYDROcodone  5 mL Oral Q12H  . enoxaparin (LOVENOX) injection  40 mg Subcutaneous Q24H  . fluticasone  2 spray Each Nare Daily  . Ipratropium-Albuterol  1 puff Inhalation Q6H  . loratadine  10 mg Oral Daily  . magnesium chloride  4 tablet Oral TID  . methylPREDNISolone (SOLU-MEDROL) injection  60 mg Intravenous Daily  . omega-3 acid ethyl esters  1 capsule Oral BID  . pantoprazole  40 mg Oral Daily  . sodium chloride flush  3 mL Intravenous Q12H  . zinc sulfate  220 mg Oral Daily   Continuous Infusions:   LOS: 6 days        Caileb Rhue Gerome Apley, MD

## 2020-05-25 NOTE — Plan of Care (Signed)
Patient is alert and oriented. Patient has been calm, cooperative, and very responsive today with the exception of medications. Patient has delayed the time of receiving medications with noon and evening med passes. Patient has been educated and made aware of the importance of complying with treatment. All safety measures are in place and patient's personal belongings are with in reach.  Will report to nurse Vera RN  Problem: Health Behavior/Discharge Planning: Goal: Ability to manage health-related needs will improve Outcome: Not Progressing   Problem: Clinical Measurements: Goal: Ability to maintain clinical measurements within normal limits will improve Outcome: Not Progressing Goal: Respiratory complications will improve Outcome: Not Progressing

## 2020-05-26 DIAGNOSIS — E785 Hyperlipidemia, unspecified: Secondary | ICD-10-CM | POA: Diagnosis not present

## 2020-05-26 DIAGNOSIS — U071 COVID-19: Secondary | ICD-10-CM | POA: Diagnosis not present

## 2020-05-26 DIAGNOSIS — N179 Acute kidney failure, unspecified: Secondary | ICD-10-CM | POA: Diagnosis not present

## 2020-05-26 DIAGNOSIS — I129 Hypertensive chronic kidney disease with stage 1 through stage 4 chronic kidney disease, or unspecified chronic kidney disease: Secondary | ICD-10-CM | POA: Diagnosis not present

## 2020-05-26 DIAGNOSIS — K580 Irritable bowel syndrome with diarrhea: Secondary | ICD-10-CM | POA: Diagnosis not present

## 2020-05-26 DIAGNOSIS — E2681 Bartter's syndrome: Secondary | ICD-10-CM | POA: Diagnosis not present

## 2020-05-26 DIAGNOSIS — N182 Chronic kidney disease, stage 2 (mild): Secondary | ICD-10-CM | POA: Diagnosis not present

## 2020-05-26 DIAGNOSIS — J9601 Acute respiratory failure with hypoxia: Secondary | ICD-10-CM | POA: Diagnosis not present

## 2020-05-26 DIAGNOSIS — J1282 Pneumonia due to coronavirus disease 2019: Secondary | ICD-10-CM | POA: Diagnosis not present

## 2020-05-26 LAB — FERRITIN: Ferritin: 409 ng/mL — ABNORMAL HIGH (ref 24–336)

## 2020-05-26 LAB — C-REACTIVE PROTEIN: CRP: 4.2 mg/dL — ABNORMAL HIGH (ref <1.0)

## 2020-05-26 LAB — D-DIMER, QUANTITATIVE: D-Dimer, Quant: 2.21 ug/mL-FEU — ABNORMAL HIGH (ref 0.00–0.50)

## 2020-05-26 MED ORDER — MENTHOL 3 MG MT LOZG: 1.0000 | LOZENGE | OROMUCOSAL | Status: DC | PRN

## 2020-05-26 MED ORDER — ALPRAZOLAM 1 MG PO TABS: 1.0000 mg | ORAL_TABLET | Freq: Three times a day (TID) | ORAL | Status: DC | PRN

## 2020-05-26 MED ORDER — PREDNISONE 20 MG PO TABS
40.0000 mg | ORAL_TABLET | Freq: Every day | ORAL | Status: DC
  Administered 2020-05-26 – 2020-05-27 (×2): 40 mg via ORAL
  Filled 2020-05-26 (×2): qty 2

## 2020-05-26 MED ORDER — IPRATROPIUM-ALBUTEROL 20-100 MCG/ACT IN AERS
1.0000 | INHALATION_SPRAY | Freq: Two times a day (BID) | RESPIRATORY_TRACT | Status: DC
  Administered 2020-05-26 – 2020-05-27 (×3): 1 via RESPIRATORY_TRACT
  Filled 2020-05-26: qty 4

## 2020-05-26 NOTE — Progress Notes (Signed)
Occupational Therapy Treatment Patient Details Name: David Martin MRN: [id number] DOB: 1975/10/29 Today's Date: 05/26/2020    History of present illness 44 y.o. male with medical history significant of anxiety, hyperlipidemia, Bartter's syndrome,?  Hypertension (patient denies states he has whitecoat hypertension) who recently diagnosed with COVID-19 on 05/17/2020, had presented to Covid infusion clinic to receive monoclonal antibody however noted to have hypoxia with sats of 79 to 82% on room air   OT comments  Patient supine in bed on 4L [place] when therapist entered the room. Patient reports his "breathing feels worse today." Patient able to perform bed mobility and ambulation with modified independence.Patient ambulated in room 3 short laps - with slow walking and lingering standing on 6 liters with o2 sat drop to 85%. Patient reporting having a panic attack and getting hot. Patient used fan on face and returned to supine to recover. Patient performed second bout of ambulation of approx 3 minutes - with ambulating around the room slowly and with prolonged standing. Once again reporting "panic attack" and sitting in recliner to recover. o2 sat down to 82% and HR 117.  Patient requires encouragement for activity, instruction on use of breathing techniques, reiteration on the need for movement and use of breathing device, proning and side lying. Cont POC to improve patient's activity tolerance.   Follow Up Recommendations  No OT follow up    Equipment Recommendations  None recommended by OT    Recommendations for Other Services      Precautions / Restrictions Precautions Precaution Comments: Patient has anxiety and has "panic attacks" needing to rest and fan himself       Mobility Bed Mobility Overal bed mobility: Modified Independent                Transfers Overall transfer level: Modified independent                    Balance Overall balance assessment: No apparent  balance deficits (not formally assessed)                                         ADL either performed or assessed with clinical judgement   ADL                                               Vision Baseline Vision/History: No visual deficits     Perception     Praxis      Cognition   Behavior During Therapy: Flat affect Overall Cognitive Status: Within Functional Limits for tasks assessed                                          Exercises Other Exercises Other Exercises: Laps in room x 2   Shoulder Instructions       General Comments      Pertinent Vitals/ Pain       Pain Assessment: No/denies pain  Home Living  Prior Functioning/Environment              Frequency  Min 2X/week        Progress Toward Goals  OT Goals(current goals can now be found in the care plan section)  Progress towards OT goals: Progressing toward goals  Acute Rehab OT Goals Patient Stated Goal: working out at gym OT Goal Formulation: With patient Time For Goal Achievement: 06/07/20 Potential to Achieve Goals: Good  Plan Discharge plan remains appropriate    Co-evaluation                 AM-PAC OT "6 Clicks" Daily Activity     Outcome Measure   Help from another person eating meals?: None Help from another person taking care of personal grooming?: A Little Help from another person toileting, which includes using toliet, bedpan, or urinal?: A Little Help from another person bathing (including washing, rinsing, drying)?: A Little Help from another person to put on and taking off regular upper body clothing?: None Help from another person to put on and taking off regular lower body clothing?: A Little 6 Click Score: 20    End of Session Equipment Utilized During Treatment: Oxygen  OT Visit Diagnosis: Muscle weakness (generalized) (M62.81)   Activity  Tolerance Patient tolerated treatment well   Patient Left in chair;with call bell/phone within reach   Nurse Communication Mobility status        Time: 5277-8242 OT Time Calculation (min): 29 min  Charges: OT General Charges $OT Visit: 1 Visit OT Treatments $Therapeutic Activity: 23-37 mins  Thedora Rings, OTR/L [place]  Office [phone] Pager: [place] 05/26/2020, 1:11 PM

## 2020-05-26 NOTE — Progress Notes (Signed)
PROGRESS NOTE    ELDON ZIETLOW  WHQ:[id number] DOB: [birth year] DOA: 05/19/2020 PCP: [name], MD    Brief Narrative:  Patient admitted to the hospital with working diagnosis of acute hypoxic respiratory failure due to SARS COVID-19 viral pneumonia.  44 year old male with past medical history for anxiety, dyslipidemia, hypertension and Bartter syndrome. He was diagnosed with COVID-19 on9/17/2021 his symptoms consistent with generalized fatigue, metallic smell and taste, along with intermittent diarrhea for about a week prior to diagnosis. On 9/19/21he presented to the outpatient infusion clinic for monoclonal antibody treatment, he was noted to be hypoxemic down to 79 to 82% on room air, he was placed on supplemental oxygen 4 L/min and he was referred to the hospital.On his initial physical examination blood pressure 120/79, heart rate 86, respiratory rate 25, temperature 98.9, oxygen saturation 91% on 6 L/min per nasal cannula. His lungs had coarse breath sounds bilaterally but no wheezing, heart S1-S2, present and rhythmic, soft abdomen, no lower extremity edema. Sodium 141, potassium 3.5, chloride 102, bicarb 27, glucose 132, BUN 28, creatinine 1.67, white count 13.8, hemoglobin 13.5, hematocrit 41.5, platelets 296.  Chest radiograph with bilateral interstitial infiltrates, lower lobes and right upper lobe. EKG 86 bpm, normal axis, normal intervals, sinus rhythm, no ST segment or T wave changes.  [place] lower extremities 05/20/20 negative for DVT.CT chest on 05/20/20 negative for pulmonary embolism. Diffuse bilateral ground glass opacities.  Patient continued to have significant hypoxemia on ambulation, oxygen saturation 82% on 6 L/min per [place] on ambulation   Assessment & Plan:   Principal Problem:   Acute respiratory failure due to COVID-19 [facility]) Active Problems:   HTN (hypertension)   Bartters syndrome (HCC)   History of panic attacks   Hyperlipidemia    Anxiety    1.  Acute hypoxic respiratory failure due to SARS COVID-19 viral pneumonia, diffuse bilateral lung injury.  Patient was admitted to the medical ward, he received aggressive medical therapy with intravenous steroids, remdesivir and baricitinib. Supplemental oxygen per nasal cannula, bronchodilator therapy, antitussive agents and airway clearing techniques. Patient had a prolonged stay due to persistent oxygen desaturation with ambulation.  His CT chest showed extensive, diffuse lung injury related to viral pneumonia. His symptoms slowly have been improving, at rest he tolerates 4 L per nasal cannula.  His symptoms and inflammatory markers are improving.  COVID-19 Labs  Recent Labs    05/24/20 0351 05/25/20 0507 05/26/20 0523  DDIMER 3.41* 2.67* 2.21*  FERRITIN 314 380* 409*  CRP 2.5* 3.7* 4.2*    No results found for: [place]   Patient will continue taking a slow taper of prednisone, home oxygen and follow-up with outpatient post COVID-19 pulmonary clinic.  Continue medical therapy with slow taper of steroids and bricitinib during hospitalization.   2.  Chronic kidney stage II with hypokalemia, Bartter's syndrome.  His renal function remained stable, potassium was closely followed. Patient will continue with amiloride and potassium supplementation.  Follow-up as an outpatient. At discharge sodium 139, potassium 4.7, chloride 93, bicarb 34, glucose 139, BUN 39, creatinine 1.50.  3.  Hypertension.  Continue with amiloride.  4. Anxiety. Continue with alprazolam increase to tid as needed.   Status is: Inpatient  Remains inpatient appropriate because:Inpatient level of care appropriate due to severity of illness   Dispo:  Patient From: Home  Planned Disposition: Home  Expected discharge date: 05/27/20  Medically stable for discharge: No   DVT prophylaxis: Enoxaparin   Code Status:   full  Family  Communication: I spoke over the phone with the  patient's wife about patient's  condition, plan of care, prognosis and all questions were addressed.       Subjective: Patient feels more weak today, no nausea or vomiting, continue to have dyspnea on exertion, and need of supplemental 02 at rest and with activity.   Objective: Vitals:   05/25/20 1453 05/25/20 1800 05/25/20 2107 05/26/20 0752  BP: 119/84  123/80 128/85  Pulse: 88  78 90  Resp: 18  19 18   Temp: 98.3 F (36.8 C)  98.2 F (36.8 C) 98.5 F (36.9 C)  TempSrc: Oral   Oral  SpO2: 96% (!) 83% 94% 94%  Weight:      Height:        Intake/Output Summary (Last 24 hours) at 05/26/2020 1329 Last data filed at 05/26/2020 1100 Gross per 24 hour  Intake 360 ml  Output 1400 ml  Net -1040 ml   Filed Weights   05/19/20 1900  Weight: 75.6 kg    Examination:   General: Not in pain or dyspnea., deconditioned  Neurology: Awake and alert, non focal  E ENT: no pallor, no icterus, oral mucosa moist Cardiovascular: No JVD. S1-S2 present, rhythmic, no gallops, rubs, or murmurs. No lower extremity edema. Pulmonary: positive breath sounds bilaterally,  Gastrointestinal. Abdomen soft and non tender Skin. No rashes Musculoskeletal: no joint deformities     Data Reviewed: I have personally reviewed following labs and imaging studies  CBC: Recent Labs  Lab 05/19/20 1500 05/20/20 0256 05/21/20 0519 05/22/20 0354  WBC 13.8* 12.6* 16.8* 17.5*  NEUTROABS 12.6* 11.5* 14.3* 14.0*  HGB 13.5 12.8* 11.8* 13.1  HCT 41.5 39.8 36.5* 40.8  MCV 78.6* 79.0* 79.3* 80.3  PLT 296 308 333 914   Basic Metabolic Panel: Recent Labs  Lab 05/19/20 1500 05/19/20 1500 05/20/20 0256 05/20/20 0256 05/21/20 0519 05/22/20 0354 05/23/20 0412 05/24/20 0351 05/25/20 0507  NA 141   < > 147*   < > 144 145 144 137 139  K 3.5   < > 4.8   < > 3.3* 3.5 3.1* 3.8 4.7  CL 102   < > 106   < > 104 102 98 96* 93*  CO2 27   < > 28   < > 29 29 33* 32 34*  GLUCOSE 132*   < > 148*   < > 180* 184* 216*  113* 139*  BUN 28*   < > 29*   < > 32* 32* 31* 29* 39*  CREATININE 1.67*   < > 1.55*   < > 1.42* 1.46* 1.50* 1.56* 1.50*  CALCIUM 8.0*   < > 8.4*   < > 8.1* 8.4* 8.2* 8.3* 9.3  MG 2.8*  --  2.7*  --  2.7* 2.5*  --   --   --   PHOS 3.3  --  3.6  --  4.2 4.4  --   --   --    < > = values in this interval not displayed.   GFR: Estimated Creatinine Clearance: 65.6 mL/min (A) (by C-G formula based on SCr of 1.5 mg/dL (H)). Liver Function Tests: Recent Labs  Lab 05/21/20 0519 05/22/20 0354 05/23/20 0412 05/24/20 0351 05/25/20 0507  AST 67* 230* 64* 54* 27  ALT 87* 295* 193* 181* 142*  ALKPHOS 80 94 80 71 78  BILITOT 0.5 0.5 0.4 0.7 0.4  PROT 6.4* 7.1 6.2* 6.0* 6.9  ALBUMIN 2.8* 3.2* 2.7* 2.8* 3.2*   No  results for input(s): LIPASE, AMYLASE in the last 168 hours. No results for input(s): AMMONIA in the last 168 hours. Coagulation Profile: No results for input(s): INR, PROTIME in the last 168 hours. Cardiac Enzymes: No results for input(s): CKTOTAL, CKMB, CKMBINDEX, TROPONINI in the last 168 hours. BNP (last 3 results) No results for input(s): PROBNP in the last 8760 hours. HbA1C: No results for input(s): HGBA1C in the last 72 hours. CBG: No results for input(s): GLUCAP in the last 168 hours. Lipid Profile: No results for input(s): CHOL, HDL, LDLCALC, TRIG, CHOLHDL, LDLDIRECT in the last 72 hours. Thyroid Function Tests: No results for input(s): TSH, T4TOTAL, FREET4, T3FREE, THYROIDAB in the last 72 hours. Anemia Panel: Recent Labs    05/25/20 0507 05/26/20 0523  FERRITIN 380* 409*      Radiology Studies: I have reviewed all of the imaging during this hospital visit personally     Scheduled Meds: . aMILoride  5 mg Oral Daily  . vitamin C  500 mg Oral Daily  . baricitinib  4 mg Oral Daily  . chlorpheniramine-HYDROcodone  5 mL Oral Q12H  . enoxaparin (LOVENOX) injection  40 mg Subcutaneous Q24H  . fluticasone  2 spray Each Nare Daily  . Ipratropium-Albuterol  1  puff Inhalation BID  . loratadine  10 mg Oral Daily  . magnesium chloride  4 tablet Oral TID  . omega-3 acid ethyl esters  1 capsule Oral BID  . pantoprazole  40 mg Oral Daily  . predniSONE  40 mg Oral Q breakfast  . sodium chloride flush  3 mL Intravenous Q12H  . zinc sulfate  220 mg Oral Daily   Continuous Infusions:   LOS: 7 days        Dula Havlik Gerome Apley, MD

## 2020-05-27 LAB — COMPREHENSIVE METABOLIC PANEL
ALT: 112 U/L — ABNORMAL HIGH (ref 0–44)
AST: 44 U/L — ABNORMAL HIGH (ref 15–41)
Albumin: 3.1 g/dL — ABNORMAL LOW (ref 3.5–5.0)
Alkaline Phosphatase: 75 U/L (ref 38–126)
Anion gap: 12 (ref 5–15)
BUN: 34 mg/dL — ABNORMAL HIGH (ref 6–20)
CO2: 33 mmol/L — ABNORMAL HIGH (ref 22–32)
Calcium: 8.7 mg/dL — ABNORMAL LOW (ref 8.9–10.3)
Chloride: 90 mmol/L — ABNORMAL LOW (ref 98–111)
Creatinine, Ser: 1.36 mg/dL — ABNORMAL HIGH (ref 0.61–1.24)
GFR calc Af Amer: >60 mL/min (ref >60)
GFR calc non Af Amer: >60 mL/min (ref >60)
Glucose, Bld: 128 mg/dL — ABNORMAL HIGH (ref 70–99)
Potassium: 4.5 mmol/L (ref 3.5–5.1)
Sodium: 135 mmol/L (ref 135–145)
Total Bilirubin: 0.5 mg/dL (ref 0.3–1.2)
Total Protein: 6.8 g/dL (ref 6.5–8.1)

## 2020-05-27 LAB — C-REACTIVE PROTEIN: CRP: 2.4 mg/dL — ABNORMAL HIGH (ref <1.0)

## 2020-05-27 LAB — FERRITIN: Ferritin: 481 ng/mL — ABNORMAL HIGH (ref 24–336)

## 2020-05-27 LAB — D-DIMER, QUANTITATIVE: D-Dimer, Quant: 1.39 ug/mL-FEU — ABNORMAL HIGH (ref 0.00–0.50)

## 2020-05-27 MED ORDER — ACETAMINOPHEN 325 MG PO TABS: 650.0000 mg | ORAL_TABLET | Freq: Four times a day (QID) | ORAL | Status: DC | PRN

## 2020-05-27 MED ORDER — PANTOPRAZOLE SODIUM 40 MG PO TBEC: 40.0000 mg | DELAYED_RELEASE_TABLET | Freq: Every day | ORAL | 0 refills | Status: DC

## 2020-05-27 MED ORDER — ALBUTEROL SULFATE HFA 108 (90 BASE) MCG/ACT IN AERS: 1.0000 | INHALATION_SPRAY | RESPIRATORY_TRACT | 0 refills | Status: DC | PRN

## 2020-05-27 MED ORDER — PREDNISONE 10 MG PO TABS: ORAL_TABLET | ORAL | 0 refills | Status: DC

## 2020-05-27 MED ORDER — GUAIFENESIN-DM 100-10 MG/5ML PO SYRP: 5.0000 mL | ORAL_SOLUTION | Freq: Four times a day (QID) | ORAL | 0 refills | Status: DC | PRN

## 2020-05-27 MED ORDER — ALPRAZOLAM 1 MG PO TABS: 1.0000 mg | ORAL_TABLET | Freq: Three times a day (TID) | ORAL | 0 refills | Status: DC | PRN

## 2020-05-27 MED FILL — ALBUTEROL SULFATE HFA 108 (: 108 (90 BAS | 30 days supply | Qty: 18 | Fill #0

## 2020-05-27 MED FILL — PANTOPRAZOLE SOD DR 40 MG T: 40 | 15 days supply | Qty: 15 | Fill #0

## 2020-05-27 MED FILL — predniSONE 10 MG TABS: 10 | 35 days supply | Qty: 74 | Fill #0

## 2020-05-27 NOTE — Progress Notes (Signed)
PT NOTE   O2 SATURATION QUALIFICATIONS:  (This note is used to comply with regulatory documentation for home oxygen)  Patient Saturations on Room Air at Rest = 84%  Patient Saturations on Room Air while Ambulating = NT d/t decr sats on HFNC.  Patient Saturations on  6 Liters of oxygen while Ambulating = 78-88%  Please briefly explain why patient needs home oxygen: requires O2 to  Maintain adequate O levels

## 2020-05-27 NOTE — Progress Notes (Signed)
Physical Therapy Treatment Patient Details Name: David Martin MRN: [id number] DOB: Dec 25, 1975 Today's Date: 05/27/2020    History of Present Illness 44 y.o. male with medical history significant of anxiety, hyperlipidemia, Bartter's syndrome,?  Hypertension (patient denies states he has whitecoat hypertension) who recently diagnosed with COVID-19 on 05/17/2020, had presented to Covid infusion clinic to receive monoclonal antibody however noted to have hypoxia with sats of 79 to 82% on room air    PT Comments    Pt amb one lap around unit with one standing rest. SpO2=78-91% on 4-6L HFNC. Pt with poor perfusion possibly effecting sat reading at times.  pt c/o continuous O2 sat monitor not being accurate, used dynamap as well and both were reading near the same thing--SpO2 in 70s on 4L in room, recovery time ~ 4 minutes to bring Sats back to 89-90%.  HR max 119   Follow Up Recommendations  No PT follow up     Equipment Recommendations  None recommended by PT    Recommendations for Other Services       Precautions / Restrictions Precautions Precautions: Other (comment) Precaution Comments: anxiety, has to remove clothing and have fan, monitor O2    Mobility  Bed Mobility Overal bed mobility: Modified Independent                Transfers Overall transfer level: Modified independent                  Ambulation/Gait Ambulation/Gait assistance: Supervision Gait Distance (Feet): 400 Feet Assistive device: None (or rolling O2 carrier) Gait Pattern/deviations: Step-through pattern     General Gait Details: requires 6 lts d/t incr WOB, SpO2 78% on 6L after amb half unit, brief standing rest. Tolerated amb full unit.  one standing rest break needed this session.   Stairs             Wheelchair Mobility    Modified Rankin (Stroke Patients Only)       Balance                                            Cognition Arousal/Alertness:  Awake/alert Behavior During Therapy: Flat affect Overall Cognitive Status: Within Functional Limits for tasks assessed                                 General Comments: AxO x 3 very motivated works at [employer] as a Scientist, research (physical sciences) Other Exercises: pt reports he is doing exercises, squats and amb in room to [employer] Comments        Pertinent Vitals/Pain Pain Assessment: No/denies pain    Home Living                      Prior Function            PT Goals (current goals can now be found in the care plan section) Acute Rehab PT Goals Patient Stated Goal: working out at gym PT Goal Formulation: With patient Time For Goal Achievement: 06/05/20 Potential to Achieve Goals: Good Progress towards PT goals: Progressing toward goals    Frequency    Min 3X/week      PT Plan Current plan remains appropriate    Co-evaluation  AM-PAC PT "6 Clicks" Mobility   Outcome Measure  Help needed turning from your back to your side while in a flat bed without using bedrails?: None Help needed moving from lying on your back to sitting on the side of a flat bed without using bedrails?: None Help needed moving to and from a bed to a chair (including a wheelchair)?: None Help needed standing up from a chair using your arms (e.g., wheelchair or bedside chair)?: None Help needed to walk in hospital room?: None Help needed climbing 3-5 steps with a railing? : None 6 Click Score: 24    End of Session Equipment Utilized During Treatment: Oxygen Activity Tolerance: Patient tolerated treatment well;Treatment limited secondary to medical complications (Comment) (decr sats) Patient left: in chair;with call bell/phone within reach Nurse Communication: Mobility status PT Visit Diagnosis: Other abnormalities of gait and mobility (R26.89)     Time: 1914-7829 PT Time Calculation (min) (ACUTE ONLY): 21  min  Charges:  $Gait Training: 8-22 mins                     Baxter Flattery, PT  Acute Rehab Dept ([place]) [phone] Pager [phone]  05/27/2020    [facility] 05/27/2020, 11:11 AM

## 2020-05-27 NOTE — TOC Progression Note (Signed)
Transition of Care [facility]) - Progression Note    Patient Details  Name: David Martin MRN: [id number] Date of Birth: January 30, 1976  [facility] CM/SW Contact  Joaquin Courts, RN Phone Number: 05/27/2020, 2:03 PM  Clinical Narrative:    Patient set up with [facility] for HHPT/OT.    Expected Discharge Plan: [place] Barriers to Discharge: Barriers Resolved  Expected Discharge Plan and Services Expected Discharge Plan: [place]   Discharge Planning Services: CM Consult Post Acute Care Choice: Durable Medical Equipment Living arrangements for the past 2 months: Single Family Home Expected Discharge Date: 05/27/20               DME Arranged: Oxygen DME Agency: [employer] Date DME Agency Contacted: 05/24/20 Time DME Agency Contacted: [phone] Representative spoke with at DME Agency: [place]: PT, OT [place] Agency: [place] Date [place]: 05/27/20 Time [place]: [phone] Representative spoke with at [place]: [place] ([place]) Interventions    Readmission Risk Interventions No flowsheet data found.

## 2020-05-27 NOTE — Progress Notes (Signed)
This shift pt frequently ambulated the room and up to chair.O2  Sats maintained in the 90's at 4L. Writer encouraged use of incentive spirometer.

## 2020-05-27 NOTE — Discharge Summary (Signed)
Physician Discharge Summary  David Martin XJO:[id number] DOB: [birth year] DOA: 05/19/2020  PCP: [name], MD  Admit date: 05/19/2020 Discharge date: 05/27/2020  Admitted From: Home  Disposition:  Home   Recommendations for Outpatient Follow-up and new medication changes:  1. Follow up with [name] in 2 weeks.  2. Continue slow taper prednisone. 3. As needed antitussive agents and bronchodilators.  4. Follow up with Pulmonary post COVID clinic in 2 weeks.  5. Continue self quarantine for 2 weeks, maintain self distancing and use a mask in public.  6. Patient will benefit to home health services for continue monitoring his hypoxic respiratory failure.   Home Health: yes   Equipment/Devices: home 02   Discharge Condition: stable  CODE STATUS: full  Diet recommendation: heart healthy   Brief/Interim Summary: Patient admitted to the hospital with working diagnosis of acute hypoxic respiratory failure due to SARS COVID-19 viral pneumonia.  44 year old male with past medical history for anxiety, dyslipidemia, hypertension and Bartter syndrome. He was diagnosed with COVID-19 on9/17/2021 his symptoms consistent with generalized fatigue, metallic smell and taste, along withintermittent diarrhea for about a week prior to diagnosis. On 9/19/21he presented to the outpatient infusion clinic for monoclonal antibody treatment, he was noted to be hypoxemic down to 79 to 82% on room air, he was placed on supplemental oxygen 4 L/min and he was referred to the hospital.On his initial physical examination blood pressure 120/79, heart rate 86, respiratoryrate25, temperature 98.9, oxygen saturation 91% on 6 L/min per nasal cannula. His lungs had coarse breath sounds bilaterally but no wheezing, heart S1-S2, presentandrhythmic, soft abdomen, no lower extremity edema. Sodium 141, potassium 3.5, chloride 102, bicarb 27, glucose 132, BUN 28, creatinine 1.67, white count 13.8, hemoglobin 13.5,  hematocrit 41.5, platelets 296.  Chest radiograph with bilateral interstitial infiltrates, lower lobes and right upper lobe. EKG 86 bpm, normal axis, normal intervals, sinus rhythm, no ST segment or T wave changes.  [place] lower extremities 05/20/20 negative for DVT.CT chest on 05/20/20 negative for pulmonary embolism. Diffuse bilateral ground glass opacities.  Patient continuedto have significant hypoxemia on ambulation, oxygen saturation 82% on 6 L/min per [place] on ambulation.  Slowly his symptoms and level of energy have improved, able to ambulate with physical therapy with supplemental oxygen.   1.  Acute hypoxic respiratory failure due to SARS COVID-19 viral pneumonia. Patient was admitted to the medical ward, he received supplemental oxygen per nasal cannula, aggressive medical therapy with systemic steroids, baricitinib and remdesivir. He received bronchodilator therapy, antitussive agents and airway clearing techniques with flutter valve and incentive spirometer.  His condition slowly improved, with decreased levels of dyspnea, decreased oxygen requirements and improved inflammatory markers. Further work-up with CT chest and ultrasonography of the lower extremities were negative for thromboembolism. At discharge he continued to require supplemental oxygen at rest and ambulation, he does have extensive lung injury related to acute SARS COVID-19 viral pneumonia, evidence on CT chest.  Patient will continue slow taper of prednisone at discharge, and will plan to follow-up with outpatient pulmonary/post Covid.  COVID-19 Labs  Recent Labs    05/25/20 0507 05/26/20 0523 05/27/20 0428  DDIMER 2.67* 2.21* 1.39*  FERRITIN 380* 409* 481*  CRP 3.7* 4.2* 2.4*    No results found for: SARSCOV2NAA   2.  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  His kidney function was closely monitored during his hospitalization.  Potassium was corrected.  At discharge patient  is tolerating p.o. diet adequately. Discharge sodium  135, potassium 4.7, chloride 90, bicarb 33 glucose 128, BUN 34, creatinine 1.36. Continue amiloride and potassium supplementation.  3.  Hypertension.  Continue amiloride.  4.  Anxiety.  Patient received alprazolam as needed for anxiety.  He will continue this regimen at discharge.    Discharge Diagnoses:  Principal Problem:   Acute respiratory failure due to COVID-19 [facility]) Active Problems:   HTN (hypertension)   Bartters syndrome ([place])   History of panic attacks   Hyperlipidemia   Anxiety    Discharge Instructions  Discharge Instructions    Ambulatory referral to Pulmonology   Complete by: As directed    POST COVID PNEUMONIA   Reason for referral: Other   Diet - low sodium heart healthy   Complete by: As directed    Discharge instructions   Complete by: As directed    Continue self quarantine for 2 weeks, use a mask in public and maintain physical distancing. It is recommended to get COVID 19 vaccine when recovered, per primary care.   Increase activity slowly   Complete by: As directed      Allergies as of 05/27/2020   No Known Allergies     Medication List    TAKE these medications   acetaminophen 325 MG tablet Commonly known as: TYLENOL Take 2 tablets (650 mg total) by mouth every 6 (six) hours as needed for mild pain or headache (fever >/= 101).   albuterol 108 (90 Base) MCG/ACT inhaler Commonly known as: VENTOLIN HFA Inhale 1 puff into the lungs every 4 (four) hours as needed for wheezing or shortness of breath.   ALPRAZolam 1 MG tablet Commonly known as: XANAX Take 1 tablet (1 mg total) by mouth 3 (three) times daily as needed for anxiety. What changed:   medication strength  how much to take  when to take this   aMILoride 5 MG tablet Commonly known as: MIDAMOR Take 5 mg by mouth daily.   FISH OIL TRIPLE STRENGTH PO Take 1 capsule by mouth 2 (two) times daily.    guaiFENesin-dextromethorphan 100-10 MG/5ML syrup Commonly known as: ROBITUSSIN DM Take 5 mLs by mouth every 6 (six) hours as needed for cough.   ibuprofen 200 MG tablet Commonly known as: ADVIL Take 400 mg by mouth every 6 (six) hours as needed (pain).   Klor-Con M20 20 MEQ tablet Generic drug: potassium chloride SA Take 60 mEq by mouth 3 (three) times daily.   pantoprazole 40 MG tablet Commonly known as: PROTONIX Take 1 tablet (40 mg total) by mouth daily for 15 days.   predniSONE 10 MG tablet Commonly known as: DELTASONE Take 4 tablets daily for one week, then 3 tablets daily for one week, then 2 tablets daily for one week, then 1 tablet daily for one week, than half tablet daily for one week.   SLOW-MAG PO Take 4 tablets by mouth 3 (three) times daily.            Durable Medical Equipment  (From admission, onward)         Start     Ordered   05/23/20 1430  For home use only DME oxygen  Once       Question Answer Comment  Length of Need 6 Months   Mode or (Route) Nasal cannula   Liters per Minute 4   Frequency Continuous (stationary and portable oxygen unit needed)   Oxygen conserving device Yes   Oxygen delivery system Gas      05/23/20 1429  No Known Allergies      Procedures/Studies: CT ANGIO CHEST PE W OR WO CONTRAST  Result Date: 05/20/2020 CLINICAL DATA:  44 year old male with a history of possible pulmonary embolism EXAM: CT ANGIOGRAPHY CHEST WITH CONTRAST TECHNIQUE: Multidetector CT imaging of the chest was performed using the standard protocol during bolus administration of intravenous contrast. Multiplanar CT image reconstructions and MIPs were obtained to evaluate the vascular anatomy. CONTRAST:  110mL OMNIPAQUE IOHEXOL 350 MG/ML SOLN COMPARISON:  None. FINDINGS: Cardiovascular: Heart: No cardiomegaly. No pericardial fluid/thickening. No significant coronary calcifications. Aorta: Unremarkable course, caliber, contour of the thoracic  aorta. No aneurysm or dissection flap. No periaortic fluid. Pulmonary arteries: The timing of the contrast bolus is suboptimal for the most sensitive evaluation for pulmonary emboli, however, there are no filling defects of the main pulmonary artery, lobar arteries, segmental arteries, or proximal subsegmental arteries. Mediastinum/Nodes: No mediastinal adenopathy. Unremarkable appearance of the thoracic esophagus. Unremarkable thoracic inlet Lungs/Pleura: The majority of all 5 lobes of the lung are involved with ground-glass opacity, predominantly in a peripheral distribution. There is more confluent nodular airspace opacity in the dependent aspects of the left lower lobe, at the periphery/subpleural location. No pneumothorax or pleural effusion. No central airway obstruction. No bronchial wall thickening. Upper Abdomen: No acute. Musculoskeletal: No acute displaced fracture. Degenerative changes of the spine. Review of the MIP images confirms the above findings. IMPRESSION: CT negative for pulmonary emboli. Changes of COVID pneumonia, with the majority of all 5 lung lobes affected by ground-glass opacity, as above. Electronically Signed   By: Corrie Mckusick D.O.   On: 05/20/2020 15:15   DG Chest Port 1 View  Result Date: 05/19/2020 CLINICAL DATA:  Shortness of breath, COVID, hypoxia EXAM: PORTABLE CHEST 1 VIEW COMPARISON:  03/29/2014 FINDINGS: The heart size and mediastinal contours are within normal limits. Subtle bilateral heterogeneous airspace opacity. The visualized skeletal structures are unremarkable. IMPRESSION: Subtle bilateral heterogeneous airspace opacity, generally in keeping with reported COVID infection. Electronically Signed   By: Eddie Candle M.D.   On: 05/19/2020 15:58   VAS [place] LOWER EXTREMITY VENOUS (DVT)  Result Date: 05/20/2020  Lower Venous DVTStudy Indications: D-dimer.  Comparison Study: No prior studies. Performing Technologist: Darlin Coco  Examination Guidelines: A complete  evaluation includes B-mode imaging, spectral Doppler, color Doppler, and power Doppler as needed of all accessible portions of each vessel. Bilateral testing is considered an integral part of a complete examination. Limited examinations for reoccurring indications may be performed as noted. The reflux portion of the exam is performed with the patient in reverse Trendelenburg.  +---------+---------------+---------+-----------+----------+--------------+ RIGHT    CompressibilityPhasicitySpontaneityPropertiesThrombus Aging +---------+---------------+---------+-----------+----------+--------------+ CFV      Full           Yes      Yes                                 +---------+---------------+---------+-----------+----------+--------------+ SFJ      Full                                                        +---------+---------------+---------+-----------+----------+--------------+ FV Prox  Full                                                        +---------+---------------+---------+-----------+----------+--------------+  FV Mid   Full                                                        +---------+---------------+---------+-----------+----------+--------------+ FV DistalFull                                                        +---------+---------------+---------+-----------+----------+--------------+ PFV      Full                                                        +---------+---------------+---------+-----------+----------+--------------+ POP      Full           Yes      Yes                                 +---------+---------------+---------+-----------+----------+--------------+ PTV      Full                                                        +---------+---------------+---------+-----------+----------+--------------+ PERO     Full                                                         +---------+---------------+---------+-----------+----------+--------------+   +---------+---------------+---------+-----------+----------+--------------+ LEFT     CompressibilityPhasicitySpontaneityPropertiesThrombus Aging +---------+---------------+---------+-----------+----------+--------------+ CFV      Full           Yes      Yes                                 +---------+---------------+---------+-----------+----------+--------------+ SFJ      Full                                                        +---------+---------------+---------+-----------+----------+--------------+ FV Prox  Full                                                        +---------+---------------+---------+-----------+----------+--------------+ FV Mid   Full                                                        +---------+---------------+---------+-----------+----------+--------------+  FV DistalFull                                                        +---------+---------------+---------+-----------+----------+--------------+ PFV      Full                                                        +---------+---------------+---------+-----------+----------+--------------+ POP      Full           Yes      Yes                                 +---------+---------------+---------+-----------+----------+--------------+ PTV      Full                                                        +---------+---------------+---------+-----------+----------+--------------+ PERO     Full                                                        +---------+---------------+---------+-----------+----------+--------------+     Summary: RIGHT: - There is no evidence of deep vein thrombosis in the lower extremity.  - No cystic structure found in the popliteal fossa.  LEFT: - There is no evidence of deep vein thrombosis in the lower extremity.  - No cystic structure found in the popliteal fossa.   *See table(s) above for measurements and observations. Electronically signed by Ruta Hinds MD on 05/20/2020 at 5:20:19 PM.    Final         Subjective: Patient is feeling better, he has been able to ambulate, no nausea or vomiting, no chest pain. Continue to have dyspnea on exertion.   Discharge Exam: Vitals:   05/26/20 2114 05/27/20 0628  BP: 108/75 108/71  Pulse: 86 73  Resp: 18 18  Temp: 98.2 F (36.8 C) 98.6 F (37 C)  SpO2: 96% 96%   Vitals:   05/26/20 0752 05/26/20 1349 05/26/20 2114 05/27/20 0628  BP: 128/85 114/81 108/75 108/71  Pulse: 90 93 86 73  Resp: 18 18 18 18   Temp: 98.5 F (36.9 C) 97.6 F (36.4 C) 98.2 F (36.8 C) 98.6 F (37 C)  TempSrc: Oral Oral Oral Oral  SpO2: 94% 100% 96% 96%  Weight:      Height:        General: Not in pain or dyspnea.  Neurology: Awake and alert, non focal  E ENT: no pallor, no icterus, oral mucosa moist Cardiovascular: No JVD. S1-S2 present, rhythmic, no gallops, rubs, or murmurs. No lower extremity edema. Pulmonary: positive breath sounds bilaterally, Gastrointestinal. Abdomen soft and non tender Skin. No rashes Musculoskeletal: no joint deformities   The results of significant diagnostics from this hospitalization (including imaging, microbiology, ancillary and laboratory) are listed  below for reference.     Microbiology: Recent Results (from the past 240 hour(s))  Blood Culture (routine x 2)     Status: None   Collection Time: 05/19/20  3:02 PM   Specimen: BLOOD  Result Value Ref Range Status   Specimen Description   Final    BLOOD LEFT ANTECUBITAL Performed at [facility], [address] [place], [place] [address]    Special Requests   Final    BOTTLES DRAWN AEROBIC AND ANAEROBIC Blood Culture adequate volume Performed at [place] [address], [place], [place] [address]    Culture   Final    NO GROWTH 6 DAYS Performed at [facility], [place] [address],  [place], [place] [address]    Report Status 05/25/2020 FINAL  Final  Blood Culture (routine x 2)     Status: None   Collection Time: 05/19/20  3:07 PM   Specimen: BLOOD  Result Value Ref Range Status   Specimen Description   Final    BLOOD RIGHT ANTECUBITAL Performed at [facility], [place] [address], [place], [place] [address]    Special Requests   Final    BOTTLES DRAWN AEROBIC AND ANAEROBIC Blood Culture adequate volume Performed at [facility] [address] [place], [place] [address]    Culture   Final    NO GROWTH 6 DAYS Performed at [facility], [place] [address] [place], [place] [address]    Report Status 05/25/2020 FINAL  Final  MRSA PCR Screening     Status: None   Collection Time: 05/19/20  7:04 PM   Specimen: Nasopharyngeal  Result Value Ref Range Status   MRSA by PCR NEGATIVE NEGATIVE Final    Comment:        The GeneXpert MRSA Assay (FDA approved for NASAL specimens only), is one component of a comprehensive MRSA colonization surveillance program. It is not intended to diagnose MRSA infection nor to guide or monitor treatment for MRSA infections. Performed at [facility], [place] [address] [place], [place] [address]      Labs: BNP (last 3 results) No results for input(s): BNP in the last 8760 hours. Basic Metabolic Panel: Recent Labs  Lab 05/21/20 0519 05/21/20 0519 05/22/20 0354 05/23/20 0412 05/24/20 0351 05/25/20 0507 05/27/20 0428  NA 144   < > 145 144 137 139 135  K 3.3*   < > 3.5 3.1* 3.8 4.7 4.5  CL 104   < > 102 98 96* 93* 90*  CO2 29   < > 29 33* 32 34* 33*  GLUCOSE 180*   < > 184* 216* 113* 139* 128*  BUN 32*   < > 32* 31* 29* 39* 34*  CREATININE 1.42*   < > 1.46* 1.50* 1.56* 1.50* 1.36*  CALCIUM 8.1*   < > 8.4* 8.2* 8.3* 9.3 8.7*  MG 2.7*  --  2.5*  --   --   --   --   PHOS 4.2  --  4.4  --   --   --   --    < > = values in this interval not displayed.   Liver Function Tests: Recent  Labs  Lab 05/22/20 0354 05/23/20 0412 05/24/20 0351 05/25/20 0507 05/27/20 0428  AST 230* 64* 54* 27 44*  ALT 295* 193* 181* 142* 112*  ALKPHOS 94 80 71 78 75  BILITOT 0.5 0.4 0.7 0.4 0.5  PROT 7.1  6.2* 6.0* 6.9 6.8  ALBUMIN 3.2* 2.7* 2.8* 3.2* 3.1*   No results for input(s): LIPASE, AMYLASE in the last 168 hours. No results for input(s): AMMONIA in the last 168 hours. CBC: Recent Labs  Lab 05/21/20 0519 05/22/20 0354  WBC 16.8* 17.5*  NEUTROABS 14.3* 14.0*  HGB 11.8* 13.1  HCT 36.5* 40.8  MCV 79.3* 80.3  PLT 333 331   Cardiac Enzymes: No results for input(s): CKTOTAL, CKMB, CKMBINDEX, TROPONINI in the last 168 hours. BNP: Invalid input(s): POCBNP CBG: No results for input(s): GLUCAP in the last 168 hours. D-Dimer Recent Labs    05/26/20 0523 05/27/20 0428  DDIMER 2.21* 1.39*   Hgb A1c No results for input(s): HGBA1C in the last 72 hours. Lipid Profile No results for input(s): CHOL, HDL, LDLCALC, TRIG, CHOLHDL, LDLDIRECT in the last 72 hours. Thyroid function studies No results for input(s): TSH, T4TOTAL, T3FREE, THYROIDAB in the last 72 hours.  Invalid input(s): FREET3 Anemia work up [employer]    05/26/20 0523 05/27/20 0428  FERRITIN 409* 481*   Urinalysis No results found for: COLORURINE, APPEARANCEUR, LABSPEC, [place], GLUCOSEU, [place], [place], [place], [place], UROBILINOGEN, NITRITE, LEUKOCYTESUR Sepsis Labs Invalid input(s): PROCALCITONIN,  WBC,  LACTICIDVEN Microbiology Recent Results (from the past 240 hour(s))  Blood Culture (routine x 2)     Status: None   Collection Time: 05/19/20  3:02 PM   Specimen: BLOOD  Result Value Ref Range Status   Specimen Description   Final    BLOOD LEFT ANTECUBITAL Performed at [facility], [address], [place], [place] [address]    Special Requests   Final    BOTTLES DRAWN AEROBIC AND ANAEROBIC Blood Culture adequate volume Performed at [facility] [address] [place], [place] [address]    Culture   Final    NO GROWTH 6 DAYS Performed at [facility], [place] [address], [place], [place] [address]    Report Status 05/25/2020 FINAL  Final  Blood Culture (routine x 2)     Status: None   Collection Time: 05/19/20  3:07 PM   Specimen: BLOOD  Result Value Ref Range Status   Specimen Description   Final    BLOOD RIGHT ANTECUBITAL Performed at [facility], [place] [address] [place], [place] [address]    Special Requests   Final    BOTTLES DRAWN AEROBIC AND ANAEROBIC Blood Culture adequate volume Performed at [facility] [address] [place], [place] [address]    Culture   Final    NO GROWTH 6 DAYS Performed at [facility], [place] [address], [place], [place] [address]    Report Status 05/25/2020 FINAL  Final  MRSA PCR Screening     Status: None   Collection Time: 05/19/20  7:04 PM   Specimen: Nasopharyngeal  Result Value Ref Range Status   MRSA by PCR NEGATIVE NEGATIVE Final    Comment:        The GeneXpert MRSA Assay (FDA approved for NASAL specimens only), is one component of a comprehensive MRSA colonization surveillance program. It is not intended to diagnose MRSA infection nor to guide or monitor treatment for MRSA infections. Performed at [facility], [place] [address] [place], [place] [address]      Time coordinating discharge: 45 minutes  SIGNED:   Tawni Millers, MD  [facility] 05/27/2020, 10:51 AM

## 2020-05-28 ENCOUNTER — Other Ambulatory Visit: Payer: Self-pay | Admitting: *Deleted

## 2020-05-28 NOTE — Patient Outreach (Addendum)
[facility]) Care Management  05/28/2020  David Martin May 26, 1976 [id number]   COVERING David Martin Transition of care-successful Initial Outreach-D/C 9/27 David Martin  Subjective: Initial successful telephone call to patient's preferred number in order to complete transition of care assessment; 2 HIPAA identifiers verified. Explained purpose of call and completed transition of care assessment.  States he is doing well, denies post-operative problems, no pain at this time and pt continue with his prescribed medications and tolerating diet.  Spouse are assisting with his ongoing recovery.  He denies any ongoing health issues and says he does not need a referral to one of the [place] chronic disease management programs.  He denies educational needs related to staying safe during the COVID 19 pandemic.    Objective:  Mr. Smoker was hospitalized [facility]. He was discharged to home on 05/27/20 with the need for home health services (PT/OT). RN provided the contact number [facility]) and requested pt to contact the agency for the provided services.    Assessment:  Patient voices good understanding of all discharge instructions.  See transition of care flowsheet for assessment details.   Plan:  Reviewed hospital discharge diagnosis of hypoxia (respiratory)  and treatment plan using hospital discharge instructions, assessing medication adherence, reviewing positive problems requiring provider notification and pt is aware to contact his primary care provider and specialists with any concerns. Confirmed pt is active with [facility]. No ongoing care management needs identified so will close case to [facility] services and route successful outreach letter with [facility] Management pamphlet and 24 Hour Nurse Line Magnet to [facility] clinical pool to be mailed to patient's  home address.   Raina Mina, RN Care Management Coordinator [place] Office [phone]

## 2020-05-29 ENCOUNTER — Telehealth: Payer: Self-pay

## 2020-05-29 MED FILL — aMILoride HCL 5 MG TABS: 5 | 30 days supply | Qty: 30 | Fill #6

## 2020-05-29 NOTE — Telephone Encounter (Cosign Needed)
Transition Care Management Follow-up Telephone Call  Date of discharge and from where: 05/27/20 [facility]  How have you been since you were released from the hospital? A little slow progress  Any questions or concerns? No  Items Reviewed:  Did the pt receive and understand the discharge instructions provided? Yes   Medications obtained and verified? Yes   Any new allergies since your discharge? No   Dietary orders reviewed? Yes  Do you have support at home? Yes   Functional Questionnaire: (I = Independent and D = Dependent) ADLs: I  Bathing/Dressing- I  Meal Prep- I  Eating- I  Maintaining continence- I  Transferring/Ambulation- I  Managing Meds- I  Follow up appointments reviewed:   PCP Hospital f/u appt confirmed? Pt wants  His wife to make appt for him today     [facility] f/u appt confirmed? Pt request Dr Jonni Sanger to give him a referral for Pulmonologist per her suggestion.  Are transportation arrangements needed? No   If their condition worsens, is the pt aware to call PCP or go to the Emergency Dept.? Yes  Was the patient provided with contact information for the PCP's office or ED? Yes  Was to pt encouraged to call back with questions or concerns? Yes

## 2020-05-31 ENCOUNTER — Ambulatory Visit (INDEPENDENT_AMBULATORY_CARE_PROVIDER_SITE_OTHER): Payer: 59 | Admitting: Family Medicine

## 2020-05-31 ENCOUNTER — Other Ambulatory Visit: Payer: Self-pay

## 2020-05-31 ENCOUNTER — Encounter: Payer: Self-pay | Admitting: Family Medicine

## 2020-05-31 VITALS — BP 132/70 | HR 93 | Temp 97.2°F | Wt 169.6 lb

## 2020-05-31 DIAGNOSIS — J1282 Pneumonia due to coronavirus disease 2019: Secondary | ICD-10-CM | POA: Diagnosis not present

## 2020-05-31 DIAGNOSIS — U071 COVID-19: Secondary | ICD-10-CM

## 2020-05-31 MED FILL — ALPRAZOLAM 1 MG TABS: 1 | 3 days supply | Qty: 10 | Fill #0

## 2020-05-31 NOTE — Patient Instructions (Signed)
Please return in 4-6 weeks for recheck.   We will help get you set up with the post-covid clinic.   You may get the vaccine once you have recovered fully and no longer need oxygen.   If you have any questions or concerns, please don't hesitate to send me a message via MyChart or call the office at [phone]. Thank you for visiting with [place] today! It's our pleasure caring for you.

## 2020-05-31 NOTE — Progress Notes (Signed)
Subjective  CC:  Chief Complaint  Patient presents with  . Hospitalization Follow-up    COVID positive, currently on 1-3 liters O2 constant     HPI: David Martin is a 44 y.o. male who presents to the office today to address the problems listed above in the chief complaint. I've personally reviewed recent office visit notes, hospital notes, associated labs and imaging reports and/or pertinent outside office records via chart review or CareEverywhere. Briefly, hospitalized for Covid bilateral pneumonia, length of stay 8 days.  Reviewed labs, hospital notes, chest x-rays and CT scan.  He was discharged on oxygen 3 days ago. Overall, slightly improved.  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  Pulse ox remained steady above 94%.  Remains mildly anxious but denies fevers, pleuritic chest pain, nausea or vomiting.  Appetite is excellent.  He is finishing his prednisone.  I reviewed lab work, multiple abnormal findings which will need to be rechecked in the next week or 2.  He is supposed to get scheduled with the post Covid clinic, this appointment has not yet been made. He would like to receive the vaccination when he can. He did not get monoclonal antibody infusion. His wife is scheduled to get vaccinated tomorrow.    Assessment  1. Pneumonia due to COVID-19 virus      Plan   Covid pneumonia with hypoxia: Stabilized.  Long discussion regarding expectations and prognosis.  Patient to continue oxygen, rest and hydration.  To complete his steroid taper.  Monitor fever curve.  Will help get follow-up appointment at the post Covid clinic in about a week.  Then follow-up with me in about 4 weeks.  Patient to get Covid vaccination once he has recovered from his current illness.  Questions answered.  Will need follow-up blood work as well.  Duration of visit was 40 minutes given chart review face-to-face encounter exam and management.  Follow up:   06/28/2020  No orders of  the defined types were placed in this encounter.  No orders of the defined types were placed in this encounter.     I reviewed the patients updated PMH, FH, and SocHx.    Patient Active Problem List   Diagnosis Date Noted  . Acute respiratory failure due to COVID-19 ([place]) 05/19/2020  . Hyperlipidemia 05/19/2020  . Anxiety 05/19/2020  . Pneumonia due to COVID-19 virus   . Cholelithiases 12/21/2017  . History of panic attacks 12/21/2017  . Conductive hearing loss 10/04/2017  . 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 Refusal of statin medication by patient 01/06/2016  . HTN (hypertension) 11/08/2010  . IRRITABLE BOWEL SYNDROME 04/23/2008   Current Meds  Medication Sig  . acetaminophen (TYLENOL) 325 MG tablet Take 2 tablets (650 mg total) by mouth every 6 (six) hours as needed for mild pain or headache (fever >/= 101).  Marland Kitchen albuterol (VENTOLIN HFA) 108 (90 Base) MCG/ACT inhaler Inhale 1 puff into the lungs every 4 (four) hours as needed for wheezing or shortness of breath.  . ALPRAZolam (XANAX) 1 MG tablet Take 1 tablet (1 mg total) by mouth 3 (three) times daily as needed for anxiety.  Marland Kitchen aMILoride (MIDAMOR) 5 MG tablet Take 5 mg by mouth daily.  Marland Kitchen guaiFENesin-dextromethorphan (ROBITUSSIN DM) 100-10 MG/5ML syrup Take 5 mLs by mouth every 6 (six) hours as needed for cough.  Marland Kitchen ibuprofen (ADVIL) 200 MG tablet Take 400 mg by mouth every 6 (six) hours as needed (pain).  Marland Kitchen  Magnesium Cl-Calcium Carbonate (SLOW-MAG PO) Take 4 tablets by mouth 3 (three) times daily.  . Omega-3 Fatty Acids (FISH OIL TRIPLE STRENGTH PO) Take 1 capsule by mouth 2 (two) times daily.   . pantoprazole (PROTONIX) 40 MG tablet Take 1 tablet (40 mg total) by mouth daily for 15 days.  . potassium chloride SA (KLOR-CON M20) 20 MEQ tablet Take 60 mEq by mouth 3 (three) times daily.   . predniSONE (DELTASONE) 10 MG tablet Take 4 tablets daily for one week, then 3 tablets daily for one week, then 2 tablets daily for one week,  then 1 tablet daily for one week, than half tablet daily for one week.    Allergies: Patient has No Known Allergies. Family History: Patient family history includes Dementia in his maternal aunt, maternal uncle, and mother; Diabetes in his paternal grandfather and paternal grandmother; Hyperlipidemia in his paternal grandfather and paternal grandmother; Hypertension in his father. Social History:  Patient  reports that he has never smoked. He has never used smokeless tobacco. He reports that he does not drink alcohol and does not use drugs.  Review of Systems: Constitutional: Negative for fever malaise or anorexia Cardiovascular: negative for chest pain Respiratory: negative for SOB or persistent cough Gastrointestinal: negative for abdominal pain  Objective  Vitals: BP 132/70   Pulse 93   Temp (!) 97.2 F (36.2 C) (Temporal)   Wt 169 lb 9.6 oz (76.9 kg)   SpO2 97%   BMI 24.34 kg/m  General: no acute distress but anxious appearing, A&Ox3, wearing nasal cannula oxygen HEENT: PEERL, conjunctiva normal, neck is supple Cardiovascular:  RRR without murmur or gallop.  Respiratory:  Good breath sounds bilaterally, CTAB without increased work of breathing Soft nontender abdomen Skin:  Warm, no rashes  Lab Results  Component Value Date   CREATININE 1.36 (H) 05/27/2020   BUN 34 (H) 05/27/2020   NA 135 05/27/2020   K 4.5 05/27/2020   CL 90 (L) 05/27/2020   CO2 33 (H) 05/27/2020   Lab Results  Component Value Date   ALT 112 (H) 05/27/2020   AST 44 (H) 05/27/2020   ALKPHOS 75 05/27/2020   BILITOT 0.5 05/27/2020   Lab Results  Component Value Date   WBC 17.5 (H) 05/22/2020   HGB 13.1 05/22/2020   HCT 40.8 05/22/2020   MCV 80.3 05/22/2020   PLT 331 05/22/2020      Commons side effects, risks, benefits, and alternatives for medications and treatment plan prescribed today were discussed, and the patient expressed understanding of the given instructions. Patient is instructed  to call or message via MyChart if he/she has any questions or concerns regarding our treatment plan. No barriers to understanding were identified. We discussed Red Flag symptoms and signs in detail. Patient expressed understanding regarding what to do in case of urgent or emergency type symptoms.   Medication list was reconciled, printed and provided to the patient in AVS. Patient instructions and summary information was reviewed with the patient as documented in the AVS. This note was prepared with assistance of Dragon voice recognition software. Occasional wrong-word or sound-a-like substitutions may have occurred due to the inherent limitations of voice recognition software  This visit occurred during the SARS-CoV-2 public health emergency.  Safety protocols were in place, including screening questions prior to the visit, additional usage of staff PPE, and extensive cleaning of exam room while observing appropriate contact time as indicated for disinfecting solutions.

## 2020-06-01 DIAGNOSIS — U071 COVID-19: Secondary | ICD-10-CM | POA: Diagnosis not present

## 2020-06-01 DIAGNOSIS — J9601 Acute respiratory failure with hypoxia: Secondary | ICD-10-CM | POA: Diagnosis not present

## 2020-06-01 DIAGNOSIS — I129 Hypertensive chronic kidney disease with stage 1 through stage 4 chronic kidney disease, or unspecified chronic kidney disease: Secondary | ICD-10-CM | POA: Diagnosis not present

## 2020-06-01 DIAGNOSIS — J1282 Pneumonia due to coronavirus disease 2019: Secondary | ICD-10-CM | POA: Diagnosis not present

## 2020-06-01 DIAGNOSIS — N182 Chronic kidney disease, stage 2 (mild): Secondary | ICD-10-CM | POA: Diagnosis not present

## 2020-06-04 ENCOUNTER — Ambulatory Visit (INDEPENDENT_AMBULATORY_CARE_PROVIDER_SITE_OTHER): Payer: 59 | Admitting: Nurse Practitioner

## 2020-06-04 VITALS — BP 118/74 | HR 107 | Temp 97.3°F | Ht 70.0 in | Wt 171.0 lb

## 2020-06-04 DIAGNOSIS — J9600 Acute respiratory failure, unspecified whether with hypoxia or hypercapnia: Secondary | ICD-10-CM

## 2020-06-04 DIAGNOSIS — J1282 Pneumonia due to coronavirus disease 2019: Secondary | ICD-10-CM | POA: Diagnosis not present

## 2020-06-04 DIAGNOSIS — U071 COVID-19: Secondary | ICD-10-CM | POA: Diagnosis not present

## 2020-06-04 NOTE — Progress Notes (Signed)
@Patient  ID: David Martin, male    DOB: 1975-09-13, 44 y.o.   MRN: [id number]  Chief Complaint  Patient presents with  . Hospitalization Follow-up    COVID Pos: 9/15 Hosp: 9/19-9/27. Patient stated he is feeling alot better on 2L of O2. Checks stats at home    Referring provider: [name], MD    44 year old male with history of hypertension, IBS, hearing loss, panic attacks, hyperlipidemia.  Diagnosed with Covid on 05/15/2020.  HPI   Patient presents today for post COVID care clinic visit/hospital follow-up.  Patient presented to the outpatient monoclonal antibody infusion center on 05/19/2020 and was noted to be hypoxic he was placed on O2 and admitted to the hospital.  He was treated with remdesivir, steroids, O2, baricitinib.  CTA of chest was negative for pulmonary embolism but did show diffuse bilateral groundglass opacities.  Patient was discharged home on 2 L of O2.  He was hospitalized from 05/19/2020 through 05/27/2020.  He states that he has been stable since discharge from the hospital.  He is still using 2 L of O2 continuously.  He does have a pulse ox and checks his O2 sats at home regularly.  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  He states that he feels like he is improving.  He is trying to stay active and move around throughout the day.  He does still get winded when climbing stairs.  O2 sats in the office are stable today on 2 L of O2 nasal cannula.Denies f/c/s, n/v/d, hemoptysis, PND, chest pain or edema.        No Known Allergies  Immunization History  Administered Date(s) Administered  . Influenza Inj Mdck Quad Pf 06/28/2019  . Influenza Split 06/03/2012  . Influenza Whole 06/17/2010  . Influenza,inj,Quad PF,6+ Mos 05/29/2016  . Tdap 10/30/2015    Past Medical History:  Diagnosis Date  . Anxiety   . Cholelithiases 12/21/2017   By xray; asymptomatic. Owens Loffler, MD GI [phone]  . Hyperlipidemia   . Hypertension   . Hypokalemia   .  IBS (irritable bowel syndrome)     Tobacco History: Social History   Tobacco Use  Smoking Status Never Smoker  Smokeless Tobacco Never Used   Counseling given: Not Answered   Outpatient Encounter Medications as of 06/04/2020  Medication Sig  . albuterol (VENTOLIN HFA) 108 (90 Base) MCG/ACT inhaler Inhale 1 puff into the lungs every 4 (four) hours as needed for wheezing or shortness of breath.  . ALPRAZolam (XANAX) 1 MG tablet Take 1 tablet (1 mg total) by mouth 3 (three) times daily as needed for anxiety.  Marland Kitchen aMILoride (MIDAMOR) 5 MG tablet Take 5 mg by mouth daily.  Marland Kitchen ibuprofen (ADVIL) 200 MG tablet Take 400 mg by mouth every 6 (six) hours as needed (pain).  . Magnesium Cl-Calcium Carbonate (SLOW-MAG PO) Take 4 tablets by mouth 3 (three) times daily.  . Omega-3 Fatty Acids (FISH OIL TRIPLE STRENGTH PO) Take 1 capsule by mouth 2 (two) times daily.   . potassium chloride SA (KLOR-CON M20) 20 MEQ tablet Take 60 mEq by mouth 3 (three) times daily.   . predniSONE (DELTASONE) 10 MG tablet Take 4 tablets daily for one week, then 3 tablets daily for one week, then 2 tablets daily for one week, then 1 tablet daily for one week, than half tablet daily for one week.  Marland Kitchen acetaminophen (TYLENOL) 325 MG tablet Take 2 tablets (650 mg total) by mouth every 6 (six) hours  as needed for mild pain or headache (fever >/= 101). (Patient not taking: Reported on 06/04/2020)  . guaiFENesin-dextromethorphan (ROBITUSSIN DM) 100-10 MG/5ML syrup Take 5 mLs by mouth every 6 (six) hours as needed for cough. (Patient not taking: Reported on 06/04/2020)  . pantoprazole (PROTONIX) 40 MG tablet Take 1 tablet (40 mg total) by mouth daily for 15 days.   No facility-administered encounter medications on file as of 06/04/2020.     Review of Systems  Review of Systems  Constitutional: Negative.   HENT: Negative.   Respiratory: Positive for cough and shortness of breath.   Cardiovascular: Negative.  Negative for chest pain,  palpitations and leg swelling.  Gastrointestinal: Negative.   Allergic/Immunologic: Negative.   Neurological: Negative.   Psychiatric/Behavioral: Negative.        Physical Exam  BP 118/74 (BP Location: Right Arm)   Pulse (!) 107   Temp (!) 97.3 F (36.3 C)   Ht 5\' 10"  (1.778 m)   Wt 171 lb 0.1 oz (77.6 kg)   SpO2 96%   BMI 24.54 kg/m   Wt Readings from Last 5 Encounters:  06/04/20 171 lb 0.1 oz (77.6 kg)  05/31/20 169 lb 9.6 oz (76.9 kg)  05/19/20 166 lb 10.7 oz (75.6 kg)  10/24/19 165 lb 3.2 oz (74.9 kg)  03/31/18 171 lb 9.6 oz (77.8 kg)     Physical Exam Vitals and nursing note reviewed.  Constitutional:      General: He is not in acute distress.    Appearance: He is well-developed.  Cardiovascular:     Rate and Rhythm: Normal rate and regular rhythm.  Pulmonary:     Effort: Pulmonary effort is normal.     Breath sounds: Normal breath sounds.  Musculoskeletal:     Right lower leg: No edema.     Left lower leg: No edema.  Skin:    General: Skin is warm and dry.  Neurological:     Mental Status: He is alert and oriented to person, place, and time.      Lab Results:  CBC    Component Value Date/Time   WBC 17.5 (H) 05/22/2020 0354   RBC 5.08 05/22/2020 0354   HGB 13.1 05/22/2020 0354   HCT 40.8 05/22/2020 0354   PLT 331 05/22/2020 0354   MCV 80.3 05/22/2020 0354   MCH 25.8 (L) 05/22/2020 0354   MCHC 32.1 05/22/2020 0354   RDW 13.8 05/22/2020 0354   LYMPHSABS 1.5 05/22/2020 0354   MONOABS 1.2 (H) 05/22/2020 0354   EOSABS 0.0 05/22/2020 0354   BASOSABS 0.1 05/22/2020 0354    BMET    Component Value Date/Time   NA 135 05/27/2020 0428   NA 140 06/28/2018 0000   K 4.5 05/27/2020 0428   CL 90 (L) 05/27/2020 0428   CO2 33 (H) 05/27/2020 0428   GLUCOSE 128 (H) 05/27/2020 0428   BUN 34 (H) 05/27/2020 0428   BUN 15 06/28/2018 0000   CREATININE 1.36 (H) 05/27/2020 0428   CALCIUM 8.7 (L) 05/27/2020 0428   GFRNONAA >60 05/27/2020 0428   GFRAA >60  05/27/2020 0428    BNP No results found for: BNP  ProBNP No results found for: PROBNP  Imaging: CT ANGIO CHEST PE W OR WO CONTRAST  Result Date: 05/20/2020 CLINICAL DATA:  44 year old male with a history of possible pulmonary embolism EXAM: CT ANGIOGRAPHY CHEST WITH CONTRAST TECHNIQUE: Multidetector CT imaging of the chest was performed using the standard protocol during bolus administration of intravenous contrast. Multiplanar  CT image reconstructions and MIPs were obtained to evaluate the vascular anatomy. CONTRAST:  134mL OMNIPAQUE IOHEXOL 350 MG/ML SOLN COMPARISON:  None. FINDINGS: Cardiovascular: Heart: No cardiomegaly. No pericardial fluid/thickening. No significant coronary calcifications. Aorta: Unremarkable course, caliber, contour of the thoracic aorta. No aneurysm or dissection flap. No periaortic fluid. Pulmonary arteries: The timing of the contrast bolus is suboptimal for the most sensitive evaluation for pulmonary emboli, however, there are no filling defects of the main pulmonary artery, lobar arteries, segmental arteries, or proximal subsegmental arteries. Mediastinum/Nodes: No mediastinal adenopathy. Unremarkable appearance of the thoracic esophagus. Unremarkable thoracic inlet Lungs/Pleura: The majority of all 5 lobes of the lung are involved with ground-glass opacity, predominantly in a peripheral distribution. There is more confluent nodular airspace opacity in the dependent aspects of the left lower lobe, at the periphery/subpleural location. No pneumothorax or pleural effusion. No central airway obstruction. No bronchial wall thickening. Upper Abdomen: No acute. Musculoskeletal: No acute displaced fracture. Degenerative changes of the spine. Review of the MIP images confirms the above findings. IMPRESSION: CT negative for pulmonary emboli. Changes of COVID pneumonia, with the majority of all 5 lung lobes affected by ground-glass opacity, as above. Electronically Signed   By: Corrie Mckusick D.O.   On: 05/20/2020 15:15   DG Chest Port 1 View  Result Date: 05/19/2020 CLINICAL DATA:  Shortness of breath, COVID, hypoxia EXAM: PORTABLE CHEST 1 VIEW COMPARISON:  03/29/2014 FINDINGS: The heart size and mediastinal contours are within normal limits. Subtle bilateral heterogeneous airspace opacity. The visualized skeletal structures are unremarkable. IMPRESSION: Subtle bilateral heterogeneous airspace opacity, generally in keeping with reported COVID infection. Electronically Signed   By: Eddie Candle M.D.   On: 05/19/2020 15:58   VAS [place] LOWER EXTREMITY VENOUS (DVT)  Result Date: 05/20/2020  Lower Venous DVTStudy Indications: D-dimer.  Comparison Study: No prior studies. Performing Technologist: Darlin Coco  Examination Guidelines: A complete evaluation includes B-mode imaging, spectral Doppler, color Doppler, and power Doppler as needed of all accessible portions of each vessel. Bilateral testing is considered an integral part of a complete examination. Limited examinations for reoccurring indications may be performed as noted. The reflux portion of the exam is performed with the patient in reverse Trendelenburg.  +---------+---------------+---------+-----------+----------+--------------+ RIGHT    CompressibilityPhasicitySpontaneityPropertiesThrombus Aging +---------+---------------+---------+-----------+----------+--------------+ CFV      Full           Yes      Yes                                 +---------+---------------+---------+-----------+----------+--------------+ SFJ      Full                                                        +---------+---------------+---------+-----------+----------+--------------+ FV Prox  Full                                                        +---------+---------------+---------+-----------+----------+--------------+ FV Mid   Full                                                         +---------+---------------+---------+-----------+----------+--------------+  FV DistalFull                                                        +---------+---------------+---------+-----------+----------+--------------+ PFV      Full                                                        +---------+---------------+---------+-----------+----------+--------------+ POP      Full           Yes      Yes                                 +---------+---------------+---------+-----------+----------+--------------+ PTV      Full                                                        +---------+---------------+---------+-----------+----------+--------------+ PERO     Full                                                        +---------+---------------+---------+-----------+----------+--------------+   +---------+---------------+---------+-----------+----------+--------------+ LEFT     CompressibilityPhasicitySpontaneityPropertiesThrombus Aging +---------+---------------+---------+-----------+----------+--------------+ CFV      Full           Yes      Yes                                 +---------+---------------+---------+-----------+----------+--------------+ SFJ      Full                                                        +---------+---------------+---------+-----------+----------+--------------+ FV Prox  Full                                                        +---------+---------------+---------+-----------+----------+--------------+ FV Mid   Full                                                        +---------+---------------+---------+-----------+----------+--------------+ FV DistalFull                                                        +---------+---------------+---------+-----------+----------+--------------+   PFV      Full                                                         +---------+---------------+---------+-----------+----------+--------------+ POP      Full           Yes      Yes                                 +---------+---------------+---------+-----------+----------+--------------+ PTV      Full                                                        +---------+---------------+---------+-----------+----------+--------------+ PERO     Full                                                        +---------+---------------+---------+-----------+----------+--------------+     Summary: RIGHT: - There is no evidence of deep vein thrombosis in the lower extremity.  - No cystic structure found in the popliteal fossa.  LEFT: - There is no evidence of deep vein thrombosis in the lower extremity.  - No cystic structure found in the popliteal fossa.  *See table(s) above for measurements and observations. Electronically signed by Ruta Hinds MD on 05/20/2020 at 5:20:19 PM.    Final      Assessment & Plan:   Pneumonia due to COVID-19 virus Acute hypoxic respiratory failure:  Will place referral to pulmonary - may need ONO  Keep close watch on O2 sats - may wean O2 to keep sats above 90%  Stay well hydrated  Stay active  Deep breathing exercises  Continue vitamin C 2,000 mg daily, vitamin D3 2,000 IU daily, Zinc 220 mg daily  May take tylenol or fever or pain  May take mucinex twice daily  Will check labs today    Follow up:  Follow up after seen by pulmonary       Fenton Foy, NP 06/04/2020

## 2020-06-04 NOTE — Assessment & Plan Note (Signed)
Acute hypoxic respiratory failure:  Will place referral to pulmonary - may need ONO  Keep close watch on O2 sats - may wean O2 to keep sats above 90%  Stay well hydrated  Stay active  Deep breathing exercises  Continue vitamin C 2,000 mg daily, vitamin D3 2,000 IU daily, Zinc 220 mg daily  May take tylenol or fever or pain  May take mucinex twice daily  Will check labs today    Follow up:  Follow up after seen by pulmonary

## 2020-06-04 NOTE — Patient Instructions (Signed)
Covid 19 pneumonia Acute hypoxic respiratory failure:  Will place referral to pulmonary - may need ONO  Keep close watch on O2 sats - may wean O2 to keep sats above 90%  Stay well hydrated  Stay active  Deep breathing exercises  Continue vitamin C 2,000 mg daily, vitamin D3 2,000 IU daily, Zinc 220 mg daily  May take tylenol or fever or pain  May take mucinex twice daily  Will check labs today    Follow up:  Follow up after seen by pulmonary

## 2020-06-05 ENCOUNTER — Telehealth: Payer: Self-pay

## 2020-06-05 LAB — COMPREHENSIVE METABOLIC PANEL
ALT: 84 IU/L — ABNORMAL HIGH (ref 0–44)
AST: 20 IU/L (ref 0–40)
Albumin/Globulin Ratio: 1.9 (ref 1.2–2.2)
Albumin: 4.1 g/dL (ref 4.0–5.0)
Alkaline Phosphatase: 69 IU/L (ref 44–121)
BUN/Creatinine Ratio: 19 (ref 9–20)
BUN: 23 mg/dL (ref 6–24)
Bilirubin Total: 0.4 mg/dL (ref 0.0–1.2)
CO2: 30 mmol/L — ABNORMAL HIGH (ref 20–29)
Calcium: 9.1 mg/dL (ref 8.7–10.2)
Chloride: 92 mmol/L — ABNORMAL LOW (ref 96–106)
Creatinine, Ser: 1.18 mg/dL (ref 0.76–1.27)
GFR calc Af Amer: 87 mL/min/{1.73_m2} (ref >59)
GFR calc non Af Amer: 75 mL/min/{1.73_m2} (ref >59)
Globulin, Total: 2.2 g/dL (ref 1.5–4.5)
Glucose: 89 mg/dL (ref 65–99)
Potassium: 4.3 mmol/L (ref 3.5–5.2)
Sodium: 138 mmol/L (ref 134–144)
Total Protein: 6.3 g/dL (ref 6.0–8.5)

## 2020-06-05 LAB — CBC
Hematocrit: 36.1 % — ABNORMAL LOW (ref 37.5–51.0)
Hemoglobin: 12.2 g/dL — ABNORMAL LOW (ref 13.0–17.7)
MCH: 26.1 pg — ABNORMAL LOW (ref 26.6–33.0)
MCHC: 33.8 g/dL (ref 31.5–35.7)
MCV: 77 fL — ABNORMAL LOW (ref 79–97)
Platelets: 481 10*3/uL — ABNORMAL HIGH (ref 150–450)
RBC: 4.67 x10E6/uL (ref 4.14–5.80)
RDW: 14.3 % (ref 11.6–15.4)
WBC: 19.2 10*3/uL — ABNORMAL HIGH (ref 3.4–10.8)

## 2020-06-05 NOTE — Telephone Encounter (Signed)
Type of form received: FMLA   Additional comments:    Form should be Faxed to: [employer] - [phone]  Is patient requesting call for pickup: Yes    Form placed:    Attach charge sheet.  Provider will determine charge.  Individual made aware of 3-5 business day turn around (Y/N)?Yes

## 2020-06-05 NOTE — Telephone Encounter (Signed)
I have filled out as much as I could and placed in your sign folder on the wall.

## 2020-06-07 ENCOUNTER — Telehealth: Payer: Self-pay

## 2020-06-07 NOTE — Telephone Encounter (Signed)
Called and lm for Amy to return call.

## 2020-06-07 NOTE — Telephone Encounter (Signed)
PT from [facility] called stating they saw pt for an evaluation only. Pt refused physical therapy. Please call Amy when this message has been received.

## 2020-06-10 NOTE — Telephone Encounter (Signed)
FYI

## 2020-06-10 NOTE — Telephone Encounter (Signed)
Amy states the hospital ordered home health and they went out one time and he refused to not have anymore.

## 2020-06-10 NOTE — Telephone Encounter (Signed)
LMOVM to return call, advised Amy we did not order home health

## 2020-06-10 NOTE — Telephone Encounter (Signed)
Who ordered it?? Ok.

## 2020-06-11 ENCOUNTER — Telehealth: Payer: Self-pay

## 2020-06-11 MED FILL — ALPRAZOLAM 1 MG TABS: 1 | 3 days supply | Qty: 10 | Fill #0

## 2020-06-11 NOTE — Telephone Encounter (Signed)
Patient aware that paperwork was faxed yesterday. Is requesting a hard copy. I will place a copy in folder up front

## 2020-06-11 NOTE — Telephone Encounter (Signed)
Recommend checking pulse ox q 4 hours at night. Can wean to no oxygen if sats stay > 94 throughout the entire night.

## 2020-06-11 NOTE — Telephone Encounter (Signed)
Pt states he was prescribed at home oxygen when he got out of the hospital. Pt states he is only using it now for a few hours at night. Pt asked if it was okay for him to go without the oxygen at night. Please advise.

## 2020-06-11 NOTE — Telephone Encounter (Signed)
Called and spoke wit pt, pt made aware.

## 2020-06-11 NOTE — Telephone Encounter (Signed)
See below

## 2020-06-11 NOTE — Telephone Encounter (Signed)
Pt is calling to check the status of his FMLA papers. He states he needs them as soon as possible.

## 2020-06-12 ENCOUNTER — Ambulatory Visit: Payer: 59

## 2020-06-12 NOTE — Telephone Encounter (Signed)
Pt wife called asking to speak with CMA. Wife states pt is monitoring oxygen. Wife asked if an overnight pulse ox could be ordered for pt to get an overnight reading. Please advise.

## 2020-06-12 NOTE — Telephone Encounter (Signed)
Pt wife states he would like to have a pulse ox ordered to where he can wear over night to monitor readings while sleeping since there is no way to tell what his levels drop to at night. She did reach out to pulmonology for this but they referred pt back to [place] since he is a new pt with pulmonology.

## 2020-06-13 MED FILL — POTASSIUM CHLORIDE CRYS ER: 20 | 30 days supply | Qty: 270 | Fill #5

## 2020-06-13 NOTE — Telephone Encounter (Signed)
To my knowledge, we are not prescribing continuous pulse oximeters for home use.  Instead, can check q 4 hours. If levels are ok, over 2 nights, can stop oxygen. If sats drop, then recommend wearing oxygen overnight for a night or two and then wean and recheck.

## 2020-06-16 NOTE — Telephone Encounter (Signed)
MyChart message sent to patient with PCP recommendations  

## 2020-06-17 ENCOUNTER — Telehealth: Payer: Self-pay

## 2020-06-17 NOTE — Telephone Encounter (Signed)
error 

## 2020-06-23 DIAGNOSIS — U071 COVID-19: Secondary | ICD-10-CM | POA: Diagnosis not present

## 2020-06-27 ENCOUNTER — Encounter: Payer: Self-pay | Admitting: Internal Medicine

## 2020-06-27 ENCOUNTER — Ambulatory Visit: Payer: 59 | Admitting: Internal Medicine

## 2020-06-27 ENCOUNTER — Ambulatory Visit (INDEPENDENT_AMBULATORY_CARE_PROVIDER_SITE_OTHER): Payer: 59

## 2020-06-27 ENCOUNTER — Other Ambulatory Visit: Payer: Self-pay

## 2020-06-27 DIAGNOSIS — J9600 Acute respiratory failure, unspecified whether with hypoxia or hypercapnia: Secondary | ICD-10-CM | POA: Diagnosis not present

## 2020-06-27 DIAGNOSIS — R0600 Dyspnea, unspecified: Secondary | ICD-10-CM | POA: Diagnosis not present

## 2020-06-27 DIAGNOSIS — U071 COVID-19: Secondary | ICD-10-CM | POA: Diagnosis not present

## 2020-06-27 NOTE — Patient Instructions (Signed)
To get the most out of exercise, you need to be continuously aware that you are short of breath, but never out of breath, for 30 minutes daily. As you improve, it will actually be easier for you to do the same amount of exercise  in  30 minutes so always push to the level where you are short of breath.    Make sure you check your oxygen saturations at highest level of activity to be sure it stays over 90% and keep track of it at least once a week, more often if breathing getting worse, and let me know if losing ground.    Please remember to go to the  x-ray department  for your tests - we will call you with the results when they are available.  Pulmonary follow up as needed

## 2020-06-27 NOTE — Progress Notes (Signed)
David Martin, male    DOB: 09-Jul-1976,    MRN: [id number]   Brief patient profile:  55 yobm never smoker works as RT at [employer] perfectly healthy / regular workouts then admit with covid 19 pna/ acute resp failure with onset of symptoms around 05/12/20     Admit date: 05/19/2020 Discharge date: 05/27/2020    Recommendations for Outpatient Follow-up and new medication changes:  1. Follow up with [name] in 2 weeks.  2. Continue slow taper prednisone. 3. As needed antitussive agents and bronchodilators.  4. Follow up with Pulmonary post COVID clinic in 2 weeks.  5. Continue self quarantine for 2 weeks, maintain self distancing and use a mask in public.  6. Patient will benefit to home health services for continue monitoring his hypoxic respiratory failure.   Home Health: yes   Equipment/Devices: home 02   Discharge Condition: stable     Brief/Interim Summary: Patient admitted to the hospital with working diagnosis of acute hypoxic respiratory failure due to SARS COVID-19 viral pneumonia.  44 year old male with past medical history for anxiety, dyslipidemia, hypertension and Bartter syndrome. He was diagnosed with COVID-19 on9/17/2021 his symptoms consistent with generalized fatigue, metallic smell and taste, along withintermittent diarrhea for about a week prior to diagnosis. On 9/19/21he presented to the outpatient infusion clinic for monoclonal antibody treatment, he was noted to be hypoxemic down to 79 to 82% on room air, he was placed on supplemental oxygen 4 L/min and he was referred to the hospital.On his initial physical examination blood pressure 120/79, heart rate 86, respiratoryrate25, temperature 98.9, oxygen saturation 91% on 6 L/min per nasal cannula. His lungs had coarse breath sounds bilaterally but no wheezing, heart S1-S2, presentandrhythmic, soft abdomen, no lower extremity edema. Sodium 141, potassium 3.5, chloride 102, bicarb 27, glucose 132, BUN 28,  creatinine 1.67, white count 13.8, hemoglobin 13.5, hematocrit 41.5, platelets 296.  Chest radiograph with bilateral interstitial infiltrates, lower lobes and right upper lobe. EKG 86 bpm, normal axis, normal intervals, sinus rhythm, no ST segment or T wave changes.  [place] lower extremities 05/20/20 negative for DVT.CT chest on 05/20/20 negative for pulmonary embolism. Diffuse bilateral ground glass opacities.  Patient continuedto have significant hypoxemia on ambulation, oxygen saturation 82% on 6 L/min per [place] on ambulation.  Slowly his symptoms and level of energy have improved, able to ambulate with physical therapy with supplemental oxygen.   1.  Acute hypoxic respiratory failure due to SARS COVID-19 viral pneumonia. Patient was admitted to the medical ward, he received supplemental oxygen per nasal cannula, aggressive medical therapy with systemic steroids, baricitinib and remdesivir. He received bronchodilator therapy, antitussive agents and airway clearing techniques with flutter valve and incentive spirometer.  His condition slowly improved, with decreased levels of dyspnea, decreased oxygen requirements and improved inflammatory markers. Further work-up with CT chest and ultrasonography of the lower extremities were negative for thromboembolism. At discharge he continued to require supplemental oxygen at rest and ambulation, he does have extensive lung injury related to acute SARS COVID-19 viral pneumonia, evidence on CT chest.  Patient will continue slow taper of prednisone at discharge, and will plan to follow-up with outpatient pulmonary/post Covid.  COVID-19 Labs  Recent Labs (last 2 labs)        Recent Labs    05/25/20 0507 05/26/20 0523 05/27/20 0428  DDIMER 2.67* 2.21* 1.39*  FERRITIN 380* 409* 481*  CRP 3.7* 4.2* 2.4*      Recent Labs  No results found for: SARSCOV2NAA  2.  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   His kidney function was closely monitored during his hospitalization.  Potassium was corrected.  At discharge patient is tolerating p.o. diet adequately. Discharge sodium 135, potassium 4.7, chloride 90, bicarb 33 glucose 128, BUN 34, creatinine 1.36. Continue amiloride and potassium supplementation.  3.  Hypertension.  Continue amiloride.  4.  Anxiety.  Patient received alprazolam as needed for anxiety.  He will continue this regimen at discharge.    Discharge Diagnoses:  Principal Problem:   Acute respiratory failure due to COVID-19 [facility])   HTN (hypertension)   Bartters syndrome ([place])   History of panic attacks   Hyperlipidemia   Anxiety      History of Present Illness  06/27/2020  Pulmonary/ 1st office eval/Iris Hairston  Off 02 x 3 weeks / Prednisone 10 mg one half daily to complete rx 07/02/20  Chief Complaint  Patient presents with  . Consult    Covid Sept 2021, SOB with activity   Dyspnea:  [name] and half  Walking but only moderate pace. sats low 90's walking more briskly hasn't tried jogging  Cough: mucus is white / minimal volume Sleep: flat bed on R side/ one pillow   SABA use: none   No obvious day to day or daytime variability or assoc excess/ purulent sputum or mucus plugs or hemoptysis or cp or chest tightness, subjective wheeze or overt sinus or hb symptoms.   Sleeping  without nocturnal  or early am exacerbation  of respiratory  c/o's or need for noct saba. Also denies any obvious fluctuation of symptoms with weather or environmental changes or other aggravating or alleviating factors except as outlined above   No unusual exposure hx or h/o childhood pna/ asthma or knowledge of premature birth.  Current Allergies, Complete Past Medical History, Past Surgical History, Family History, and Social History were reviewed in [employer] [employer] record.  ROS  The following are not active complaints unless bolded Hoarseness, sore throat, dysphagia, dental  problems, itching, sneezing,  nasal congestion or discharge of excess mucus or purulent secretions, ear ache,   fever, chills, sweats, unintended wt loss or wt gain, classically pleuritic or exertional cp,  orthopnea pnd or arm/hand swelling  or leg swelling, presyncope, palpitations, abdominal pain, anorexia, nausea, vomiting, diarrhea  or change in bowel habits or change in bladder habits, change in stools or change in urine, dysuria, hematuria,  rash, arthralgias, visual complaints, headache, numbness, weakness or ataxia or problems with walking or coordination,  change in mood/ anxious or  memory.           Past Medical History:  Diagnosis Date  . Anxiety   . Cholelithiases 12/21/2017   By xray; asymptomatic. Owens Loffler, MD GI [phone]  . Hyperlipidemia   . Hypertension   . Hypokalemia   . IBS (irritable bowel syndrome)     Outpatient Medications Prior to Visit  Medication Sig Dispense Refill  . aMILoride (MIDAMOR) 5 MG tablet Take 5 mg by mouth daily.    Marland Kitchen ibuprofen (ADVIL) 200 MG tablet Take 400 mg by mouth every 6 (six) hours as needed (pain).    . Magnesium Cl-Calcium Carbonate (SLOW-MAG PO) Take 4 tablets by mouth 3 (three) times daily.    . Omega-3 Fatty Acids (FISH OIL TRIPLE STRENGTH PO) Take 1 capsule by mouth 2 (two) times daily.     . potassium chloride SA (KLOR-CON M20) 20 MEQ tablet Take 60 mEq by mouth 3 (three)  times daily.     . predniSONE (DELTASONE) 10 MG tablet Take 4 tablets daily for one week, then 3 tablets daily for one week, then 2 tablets daily for one week, then 1 tablet daily for one week, than half tablet daily for one week. 74 tablet 0  . acetaminophen (TYLENOL) 325 MG tablet Take 2 tablets (650 mg total) by mouth every 6 (six) hours as needed for mild pain or headache (fever >/= 101). (Patient not taking: Reported on 06/27/2020)    . albuterol (VENTOLIN HFA) 108 (90 Base) MCG/ACT inhaler Inhale 1 puff into the lungs every 4 (four) hours as needed for wheezing  or shortness of breath. 1 each 0  . ALPRAZolam (XANAX) 1 MG tablet Take 1 tablet (1 mg total) by mouth 3 (three) times daily as needed for anxiety. (Patient not taking: Reported on 06/27/2020) 10 tablet 0  . guaiFENesin-dextromethorphan (ROBITUSSIN DM) 100-10 MG/5ML syrup Take 5 mLs by mouth every 6 (six) hours as needed for cough. (Patient not taking: Reported on 06/27/2020) 118 mL 0  . pantoprazole (PROTONIX) 40 MG tablet Take 1 tablet (40 mg total) by mouth daily for 15 days. 15 tablet 0   No facility-administered medications prior to visit.     Objective:     BP 112/74 (BP Location: Right Arm, Cuff Size: Normal)   Pulse 99   Temp (!) 97.4 F (36.3 C)   Ht 5\' 10"  (1.778 m)   Wt 164 lb 3.2 oz (74.5 kg)   SpO2 99% Comment: RA  BMI 23.56 kg/m   SpO2: 99 % (RA)   amb pleasant bm / a bit anxious    HEENT : pt wearing mask not removed for exam due to covid -19 concerns.    NECK :  without JVD/Nodes/TM/ nl carotid upstrokes bilaterally   LUNGS: no acc muscle use,  Nl contour chest which is clear to A and P bilaterally without cough on insp or exp maneuvers   CV:  RRR  no s3 or murmur or increase in P2, and no edema   ABD:  soft and nontender with nl inspiratory excursion in the supine position. No bruits or organomegaly appreciated, bowel sounds nl  MS:  Nl gait/ ext warm without deformities, calf tenderness, cyanosis or clubbing No obvious joint restrictions   SKIN: warm and dry without lesions    NEURO:  alert, approp, nl sensorium with  no motor or cerebellar deficits apparent.    CXR PA and Lateral:   06/27/2020 :    I personally reviewed images and   impression as follows:   Persistent L > R increased markings c/w resolving FP changes from acute lung injury due to Covid 19          Assessment   No problem-specific Assessment & Plan notes found for this encounter.     Christinia Gully, MD 06/27/2020

## 2020-06-27 NOTE — Assessment & Plan Note (Signed)
Onset of symptoms 05/12/20 see admit 05/19/20  rx steroids/  baricitinib and remdesivir. - prolonged pred taper to end 07/02/20   He still has significant FP changes on cxr L >R  But has rapidly improved clinically with only question ? Will he have relapse in FP changes p stops steroid and will he get back to 100% of prev baseline =regular workouts.  No way of knowing either answer today but best way to monitor is regular submaximal ex and check sats at peak levels comparing similar or greater levels of workload as he recovers/ reviewed.  I would think he is on track to resume regular duties by [facility] [employer] and if not needs to return here to regroup.         Each maintenance medication was reviewed in detail including emphasizing most importantly the difference between maintenance and prns and under what circumstances the prns are to be triggered using an action plan format where appropriate.  Total time for H and P, chart review, counseling, teaching device and generating customized AVS unique to this new pt office visit / charting = 40 min

## 2020-06-28 ENCOUNTER — Other Ambulatory Visit: Payer: Self-pay

## 2020-06-28 ENCOUNTER — Telehealth: Payer: Self-pay | Admitting: Internal Medicine

## 2020-06-28 ENCOUNTER — Encounter: Payer: Self-pay | Admitting: Family Medicine

## 2020-06-28 ENCOUNTER — Ambulatory Visit: Payer: 59 | Admitting: Family Medicine

## 2020-06-28 VITALS — BP 170/90 | HR 105 | Temp 97.4°F | Ht 72.0 in | Wt 164.8 lb

## 2020-06-28 DIAGNOSIS — U071 COVID-19: Secondary | ICD-10-CM

## 2020-06-28 DIAGNOSIS — R7401 Elevation of levels of liver transaminase levels: Secondary | ICD-10-CM | POA: Diagnosis not present

## 2020-06-28 DIAGNOSIS — F411 Generalized anxiety disorder: Secondary | ICD-10-CM

## 2020-06-28 DIAGNOSIS — J1282 Pneumonia due to coronavirus disease 2019: Secondary | ICD-10-CM | POA: Diagnosis not present

## 2020-06-28 LAB — CBC WITH DIFFERENTIAL/PLATELET
Absolute Monocytes: 916 cells/uL (ref 200–950)
Basophils Absolute: 46 cells/uL (ref 0–200)
Basophils Relative: 0.4 %
Eosinophils Absolute: 35 cells/uL (ref 15–500)
Eosinophils Relative: 0.3 %
HCT: 42.8 % (ref 38.5–50.0)
Hemoglobin: 13.8 g/dL (ref 13.2–17.1)
Lymphs Abs: 1508 cells/uL (ref 850–3900)
MCH: 25.4 pg — ABNORMAL LOW (ref 27.0–33.0)
MCHC: 32.2 g/dL (ref 32.0–36.0)
MCV: 78.8 fL — ABNORMAL LOW (ref 80.0–100.0)
MPV: 10.6 fL (ref 7.5–12.5)
Monocytes Relative: 7.9 %
Neutro Abs: 9094 cells/uL — ABNORMAL HIGH (ref 1500–7800)
Neutrophils Relative %: 78.4 %
Platelets: 384 10*3/uL (ref 140–400)
RBC: 5.43 10*6/uL (ref 4.20–5.80)
RDW: 15.1 % — ABNORMAL HIGH (ref 11.0–15.0)
Total Lymphocyte: 13 %
WBC: 11.6 10*3/uL — ABNORMAL HIGH (ref 3.8–10.8)

## 2020-06-28 NOTE — Telephone Encounter (Signed)
Spoke to patient, who is requesting clarification on CXR results. Patient stated that he received results via mychart. Per our records, it does not appear that CXR has been read by radiology yet nor Dr. Melvyn Novas result note . He is concerned about results.  Patient would like results today, as he is very anxious.    Dr. Melvyn Novas, please advise. Thanks.

## 2020-06-28 NOTE — Progress Notes (Signed)
Subjective  CC:  Chief Complaint  Patient presents with  . post COVID pneumonia    heart rate elevated randomly - 120's     HPI: David Martin is a 44 y.o. male who presents to the office today to address the problems listed above in the chief complaint.  2 month f/u from covid pnuemonia; improved. Still fatigued. Reviewed post covid clinic f/u and recs. No longer requiring oxygen nor hypoxic; however he is checking his pulse ox frequently. He remains on his long steroid taper - due to complete in the next 7-10 days. Continues to struggle with anxiety and worry over his well being. Was evaluated yesterday by pulm/dr. Melvyn Novas; had full blown panic attack at 1am over concerns. Wife recommends him use his xanax but he declines. Struggled with anxiety during hospital stay as well. He denies prior mood or anxiety problems but admits he had used exercise as a stress reliever. Now, this is difficult due to his illness. He denies depressive sxs. He prefers to "cope" with the panic sxs and work through them on his own.   F/u elevated lfts and chemistry abnormlaities; due to recheck labs today.    Assessment  1. Pneumonia due to COVID-19 virus   2. Elevated liver transaminase level      Plan   covid pneumonia:  Improved; no longer hypoxic. Fatigued but energy is slowly improving. He feels he may not be able to return to work in 2 weeks as planned. He worries about his energy level mostly. Recheck labs. 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   Discussed anxiety component to his illness at length. Pt may use xanax. I believe this would be helpful. No indication for other treatment at this time.   Follow up: 4 weeks for recheck  Visit date not found  Orders Placed This Encounter  Procedures  . COMPLETE METABOLIC PANEL WITH GFR  . CBC with Differential/Platelet   No orders of the defined types were placed in this encounter.     I  reviewed the patients updated PMH, FH, and SocHx.    Patient Active Problem List   Diagnosis Date Noted  . Acute respiratory failure due to COVID-19 ([place]) 05/19/2020  . Hyperlipidemia 05/19/2020  . Anxiety 05/19/2020  . Pneumonia due to COVID-19 virus   . Cholelithiases 12/21/2017  . History of panic attacks 12/21/2017  . Conductive hearing loss 10/04/2017  . Bartters syndrome ([place]) 01/06/2016  . Refusal of statin medication by patient 01/06/2016  . HTN (hypertension) 11/08/2010  . IRRITABLE BOWEL SYNDROME 04/23/2008   Current Meds  Medication Sig  . ALPRAZolam (XANAX) 1 MG tablet Take 1 tablet (1 mg total) by mouth 3 (three) times daily as needed for anxiety.  Marland Kitchen aMILoride (MIDAMOR) 5 MG tablet Take 5 mg by mouth daily.  Marland Kitchen ibuprofen (ADVIL) 200 MG tablet Take 400 mg by mouth every 6 (six) hours as needed (pain).  . Magnesium Cl-Calcium Carbonate (SLOW-MAG PO) Take 4 tablets by mouth 3 (three) times daily.  . Omega-3 Fatty Acids (FISH OIL TRIPLE STRENGTH PO) Take 1 capsule by mouth 2 (two) times daily.   . potassium chloride SA (KLOR-CON M20) 20 MEQ tablet Take 60 mEq by mouth 3 (three) times daily.   . predniSONE (DELTASONE) 10 MG tablet Take 4 tablets daily for one week, then 3 tablets daily for one week, then 2 tablets daily for one week, then 1 tablet daily  for one week, than half tablet daily for one week.  . [DISCONTINUED] acetaminophen (TYLENOL) 325 MG tablet Take 2 tablets (650 mg total) by mouth every 6 (six) hours as needed for mild pain or headache (fever >/= 101).    Allergies: Patient has No Known Allergies. Family History: Patient family history includes Dementia in his maternal aunt, maternal uncle, and mother; Diabetes in his paternal grandfather and paternal grandmother; Hyperlipidemia in his paternal grandfather and paternal grandmother; Hypertension in his father. Social History:  Patient  reports that he has never smoked. He has never used smokeless tobacco. He  reports that he does not drink alcohol and does not use drugs.  Review of Systems: Constitutional: Negative for fever malaise or anorexia Cardiovascular: negative for chest pain Respiratory: negative for SOB or persistent cough Gastrointestinal: negative for abdominal pain  Objective  Vitals: BP (!) 170/90   Pulse (!) 105   Temp (!) 97.4 F (36.3 C) (Temporal)   Ht 6' (1.829 m)   Wt 164 lb 12.8 oz (74.8 kg)   SpO2 97%   BMI 22.35 kg/m  General: no acute distress , A&Ox3, anxious appearing HEENT: PEERL, conjunctiva normal, neck is supple Cardiovascular:  RRR without murmur or gallop.  Respiratory:  Good breath sounds bilaterally, CTAB with normal respiratory effort Skin:  Warm, no rashes  Office Visit on 06/28/2020  Component Date Value Ref Range Status  . Glucose, Bld 06/28/2020 103* 65 - 99 mg/dL Final  . BUN 06/28/2020 19  7 - 25 mg/dL Final  . Creat 06/28/2020 1.18  0.60 - 1.35 mg/dL Final  . GFR, Est Non African American 06/28/2020 75  > OR = 60 mL/min/1.71m2 Final  . GFR, Est African American 06/28/2020 87  > OR = 60 mL/min/1.33m2 Final  . BUN/Creatinine Ratio 97/98/9211 NOT APPLICABLE  6 - 22 (calc) Final  . Sodium 06/28/2020 142  135 - 146 mmol/L Final  . Potassium 06/28/2020 4.4  3.5 - 5.3 mmol/L Final  . Chloride 06/28/2020 101  98 - 110 mmol/L Final  . CO2 06/28/2020 30  20 - 32 mmol/L Final  . Calcium 06/28/2020 9.9  8.6 - 10.3 mg/dL Final  . Total Protein 06/28/2020 7.5  6.1 - 8.1 g/dL Final  . Albumin 06/28/2020 4.8  3.6 - 5.1 g/dL Final  . Globulin 06/28/2020 2.7  1.9 - 3.7 g/dL (calc) Final  . AG Ratio 06/28/2020 1.8  1.0 - 2.5 (calc) Final  . Total Bilirubin 06/28/2020 0.7  0.2 - 1.2 mg/dL Final  . Alkaline phosphatase (APISO) 06/28/2020 49  36 - 130 U/L Final  . AST 06/28/2020 15  10 - 40 U/L Final  . ALT 06/28/2020 26  9 - 46 U/L Final  . WBC 06/28/2020 11.6* 3.8 - 10.8 Thousand/uL Final  . RBC 06/28/2020 5.43  4.20 - 5.80 Million/uL Final  .  Hemoglobin 06/28/2020 13.8  13.2 - 17.1 g/dL Final  . HCT 06/28/2020 42.8  38 - 50 % Final  . MCV 06/28/2020 78.8* 80.0 - 100.0 fL Final  . MCH 06/28/2020 25.4* 27.0 - 33.0 pg Final  . MCHC 06/28/2020 32.2  32.0 - 36.0 g/dL Final  . RDW 06/28/2020 15.1* 11.0 - 15.0 % Final  . Platelets 06/28/2020 384  140 - 400 Thousand/uL Final  . MPV 06/28/2020 10.6  7.5 - 12.5 fL Final  . Neutro Abs 06/28/2020 9,094* 1,500 - 7,800 cells/uL Final  . Lymphs Abs 06/28/2020 1,508  850 - 3,900 cells/uL Final  . Absolute Monocytes  06/28/2020 916  200 - 950 cells/uL Final  . Eosinophils Absolute 06/28/2020 35  15.0 - 500.0 cells/uL Final  . Basophils Absolute 06/28/2020 46  0.0 - 200.0 cells/uL Final  . Neutrophils Relative % 06/28/2020 78.4  % Final  . Total Lymphocyte 06/28/2020 13.0  % Final  . Monocytes Relative 06/28/2020 7.9  % Final  . Eosinophils Relative 06/28/2020 0.3  % Final  . Basophils Relative 06/28/2020 0.4  % Final      Commons side effects, risks, benefits, and alternatives for medications and treatment plan prescribed today were discussed, and the patient expressed understanding of the given instructions. Patient is instructed to call or message via MyChart if he/she has any questions or concerns regarding our treatment plan. No barriers to understanding were identified. We discussed Red Flag symptoms and signs in detail. Patient expressed understanding regarding what to do in case of urgent or emergency type symptoms.   Medication list was reconciled, printed and provided to the patient in AVS. Patient instructions and summary information was reviewed with the patient as documented in the AVS. This note was prepared with assistance of Dragon voice recognition software. Occasional wrong-word or sound-a-like substitutions may have occurred due to the inherent limitations of voice recognition software  This visit occurred during the SARS-CoV-2 public health emergency.  Safety protocols were in  place, including screening questions prior to the visit, additional usage of staff PPE, and extensive cleaning of exam room while observing appropriate contact time as indicated for disinfecting solutions.

## 2020-06-28 NOTE — Patient Instructions (Signed)
Please return in 4 weeks for recheck.  Let me know how you are feeling next week.   Consider using the xanax. I feel that your worry is hindering your recovery.   I will release your lab results to you on your MyChart account with further instructions. Please reply with any questions.   If you have any questions or concerns, please don't hesitate to send me a message via MyChart or call the office at [phone]. Thank you for visiting with [place] today! It's our pleasure caring for you.

## 2020-06-28 NOTE — Telephone Encounter (Signed)
Explained cxr findings to pt

## 2020-06-29 LAB — COMPLETE METABOLIC PANEL WITH GFR
AG Ratio: 1.8 (calc) (ref 1.0–2.5)
ALT: 26 U/L (ref 9–46)
AST: 15 U/L (ref 10–40)
Albumin: 4.8 g/dL (ref 3.6–5.1)
Alkaline phosphatase (APISO): 49 U/L (ref 36–130)
BUN: 19 mg/dL (ref 7–25)
CO2: 30 mmol/L (ref 20–32)
Calcium: 9.9 mg/dL (ref 8.6–10.3)
Chloride: 101 mmol/L (ref 98–110)
Creat: 1.18 mg/dL (ref 0.60–1.35)
GFR, Est African American: 87 mL/min/{1.73_m2} (ref >=60)
GFR, Est Non African American: 75 mL/min/{1.73_m2} (ref >=60)
Globulin: 2.7 g/dL (calc) (ref 1.9–3.7)
Glucose, Bld: 103 mg/dL — ABNORMAL HIGH (ref 65–99)
Potassium: 4.4 mmol/L (ref 3.5–5.3)
Sodium: 142 mmol/L (ref 135–146)
Total Bilirubin: 0.7 mg/dL (ref 0.2–1.2)
Total Protein: 7.5 g/dL (ref 6.1–8.1)

## 2020-07-01 MED FILL — aMILoride HCL 5 MG TABS: 5 | 30 days supply | Qty: 30 | Fill #0

## 2020-07-02 ENCOUNTER — Other Ambulatory Visit: Payer: Self-pay | Admitting: Internal Medicine

## 2020-07-02 DIAGNOSIS — U071 COVID-19: Secondary | ICD-10-CM

## 2020-07-02 DIAGNOSIS — J1282 Pneumonia due to coronavirus disease 2019: Secondary | ICD-10-CM

## 2020-07-02 NOTE — Progress Notes (Signed)
Pt notified of results and wants to have repeat cxr in 6 wks so I have placed an order for this.

## 2020-07-04 ENCOUNTER — Telehealth: Payer: Self-pay | Admitting: Internal Medicine

## 2020-07-05 ENCOUNTER — Encounter: Payer: Self-pay | Admitting: Family Medicine

## 2020-07-05 NOTE — Telephone Encounter (Signed)
David Martin for flu shot   I have him down to return to work on Nov 15 and if physically doesn't think he can do so to see me to re-evaluate

## 2020-07-05 NOTE — Telephone Encounter (Signed)
Spoke to patient, who is questioning when he can return back to work. He would also like to know when he can get flu vaccine. Patient has not had covid vaccines, due to recently having covid. He plans to get vaccines early jan.    MW, please advise on returning to work and flu shot? Thanks

## 2020-07-05 NOTE — Telephone Encounter (Signed)
Called and spoke with patient and advised of Dr. Gustavus Bryant recommendations.  He states his pcp is handling his FMLA paperwork and will let our office know if he needs anything additionally.  Nothing further needed.

## 2020-07-08 DIAGNOSIS — Z8616 Personal history of COVID-19: Secondary | ICD-10-CM | POA: Diagnosis not present

## 2020-07-08 DIAGNOSIS — R7989 Other specified abnormal findings of blood chemistry: Secondary | ICD-10-CM | POA: Diagnosis not present

## 2020-07-08 DIAGNOSIS — E876 Hypokalemia: Secondary | ICD-10-CM | POA: Diagnosis not present

## 2020-07-19 ENCOUNTER — Other Ambulatory Visit (HOSPITAL_COMMUNITY): Payer: Self-pay | Admitting: Nephrology

## 2020-07-20 MED FILL — POTASSIUM CHLORIDE CRYS ER: 20 | 30 days supply | Qty: 270 | Fill #0

## 2020-07-24 DIAGNOSIS — U071 COVID-19: Secondary | ICD-10-CM | POA: Diagnosis not present

## 2020-08-04 MED FILL — aMILoride HCL 5 MG TABS: 5 | 30 days supply | Qty: 30 | Fill #1

## 2020-08-07 ENCOUNTER — Ambulatory Visit (INDEPENDENT_AMBULATORY_CARE_PROVIDER_SITE_OTHER): Payer: 59

## 2020-08-07 DIAGNOSIS — R918 Other nonspecific abnormal finding of lung field: Secondary | ICD-10-CM | POA: Diagnosis not present

## 2020-08-07 DIAGNOSIS — U071 COVID-19: Secondary | ICD-10-CM

## 2020-08-07 DIAGNOSIS — J1282 Pneumonia due to coronavirus disease 2019: Secondary | ICD-10-CM

## 2020-08-08 ENCOUNTER — Telehealth: Payer: Self-pay | Admitting: Internal Medicine

## 2020-08-08 NOTE — Telephone Encounter (Signed)
08/08/2020  08/07/20 Chest x-ray results are in chart.  They have not been documented on by Dr. Melvyn Novas.  Will route for his recommendations.  Then we can follow-up with the patient.  Dr. Melvyn Novas please advise on the results of the recent chest x-ray.  Wyn Quaker, FNP

## 2020-08-08 NOTE — Telephone Encounter (Signed)
sats at peak usually around 93-94   Needs f/u final ov in 3 m with pfts and cxr

## 2020-08-09 NOTE — Telephone Encounter (Signed)
ATC Patient.  LM to call back to schedule 3 months PFT and follow up appointment with Dr. Melvyn Novas.

## 2020-08-16 NOTE — Telephone Encounter (Signed)
lmtcb for pt. Pt already has a March appt, just needs PFT.

## 2020-08-19 ENCOUNTER — Encounter: Payer: Self-pay | Admitting: Emergency Medicine

## 2020-08-19 NOTE — Telephone Encounter (Signed)
Due to several unsuccessful attempts to reach pt a letter will be mailed to pt to call our office to schedule his PFT, and encounter will be closed.    Triage, please print and mail letter to pt's home. Encounter can be closed once complete. Thanks.

## 2020-08-19 NOTE — Telephone Encounter (Signed)
Letter printed and placed in outgoing mail. Nothing further needed at this time- will close encounter.

## 2020-08-20 MED FILL — POTASSIUM CHLORIDE CRYS ER: 20 | 30 days supply | Qty: 270 | Fill #1

## 2020-08-23 DIAGNOSIS — U071 COVID-19: Secondary | ICD-10-CM | POA: Diagnosis not present

## 2020-09-05 MED FILL — aMILoride HCL 5 MG TABS: 5 | 30 days supply | Qty: 30 | Fill #2

## 2020-09-05 NOTE — Telephone Encounter (Signed)
Dr. Melvyn Novas, please see mychart message sent by pt and advise.

## 2020-09-09 ENCOUNTER — Encounter: Payer: Self-pay | Admitting: Family Medicine

## 2020-09-09 DIAGNOSIS — Z20828 Contact with and (suspected) exposure to other viral communicable diseases: Secondary | ICD-10-CM | POA: Diagnosis not present

## 2020-09-23 DIAGNOSIS — U071 COVID-19: Secondary | ICD-10-CM | POA: Diagnosis not present

## 2020-09-26 MED FILL — POTASSIUM CHLORIDE CRYS ER: 20 | 30 days supply | Qty: 270 | Fill #2

## 2020-10-07 ENCOUNTER — Other Ambulatory Visit (HOSPITAL_COMMUNITY): Payer: Self-pay | Admitting: Nephrology

## 2020-10-07 MED FILL — aMILoride HCL 5 MG TABS: 5 | 30 days supply | Qty: 30 | Fill #0

## 2020-10-17 DIAGNOSIS — Z20828 Contact with and (suspected) exposure to other viral communicable diseases: Secondary | ICD-10-CM | POA: Diagnosis not present

## 2020-10-24 DIAGNOSIS — U071 COVID-19: Secondary | ICD-10-CM | POA: Diagnosis not present

## 2020-10-29 MED FILL — POTASSIUM CHLORIDE CRYS ER: 20 | 30 days supply | Qty: 270 | Fill #3

## 2020-11-08 ENCOUNTER — Ambulatory Visit: Payer: 59 | Admitting: Internal Medicine

## 2020-11-08 MED FILL — aMILoride HCL 5 MG TABS: 5 | 30 days supply | Qty: 30 | Fill #1

## 2020-11-11 ENCOUNTER — Ambulatory Visit (INDEPENDENT_AMBULATORY_CARE_PROVIDER_SITE_OTHER): Payer: 59

## 2020-11-11 ENCOUNTER — Other Ambulatory Visit: Payer: Self-pay

## 2020-11-11 ENCOUNTER — Encounter: Payer: Self-pay | Admitting: Internal Medicine

## 2020-11-11 ENCOUNTER — Ambulatory Visit: Payer: 59 | Admitting: Internal Medicine

## 2020-11-11 DIAGNOSIS — J9600 Acute respiratory failure, unspecified whether with hypoxia or hypercapnia: Secondary | ICD-10-CM

## 2020-11-11 DIAGNOSIS — U071 COVID-19: Secondary | ICD-10-CM

## 2020-11-11 DIAGNOSIS — R059 Cough, unspecified: Secondary | ICD-10-CM | POA: Diagnosis not present

## 2020-11-11 NOTE — Progress Notes (Unsigned)
David Martin, male    DOB: February 24, 1976,    MRN: [id number]   Brief patient profile:  45  yobm never smoker works as RT at [employer] perfectly healthy / regular workouts then admit with covid 19 pna/ acute resp failure with onset of symptoms around 05/12/20     Admit date: 05/19/2020 Discharge date: 05/27/2020    Recommendations for Outpatient Follow-up and new medication changes:  1. Follow up with [name] in 2 weeks.  2. Continue slow taper prednisone. 3. As needed antitussive agents and bronchodilators.  4. Follow up with Pulmonary post COVID clinic in 2 weeks.  5. Continue self quarantine for 2 weeks, maintain self distancing and use a mask in public.  6. Patient will benefit to home health services for continue monitoring his hypoxic respiratory failure.   Home Health: yes   Equipment/Devices: home 02   Discharge Condition: stable     Brief/Interim Summary: Patient admitted to the hospital with working diagnosis of acute hypoxic respiratory failure due to SARS COVID-19 viral pneumonia.  45 year old male with past medical history for anxiety, dyslipidemia, hypertension and Bartter syndrome. He was diagnosed with COVID-19 on9/17/2021 his symptoms consistent with generalized fatigue, metallic smell and taste, along withintermittent diarrhea for about a week prior to diagnosis. On 9/19/21he presented to the outpatient infusion clinic for monoclonal antibody treatment, he was noted to be hypoxemic down to 79 to 82% on room air, he was placed on supplemental oxygen 4 L/min and he was referred to the hospital.On his initial physical examination blood pressure 120/79, heart rate 86, respiratoryrate25, temperature 98.9, oxygen saturation 91% on 6 L/min per nasal cannula. His lungs had coarse breath sounds bilaterally but no wheezing, heart S1-S2, presentandrhythmic, soft abdomen, no lower extremity edema. Sodium 141, potassium 3.5, chloride 102, bicarb 27, glucose 132, BUN  28, creatinine 1.67, white count 13.8, hemoglobin 13.5, hematocrit 41.5, platelets 296.  Chest radiograph with bilateral interstitial infiltrates, lower lobes and right upper lobe. EKG 86 bpm, normal axis, normal intervals, sinus rhythm, no ST segment or T wave changes.  [place] lower extremities 05/20/20 negative for DVT.CT chest on 05/20/20 negative for pulmonary embolism. Diffuse bilateral ground glass opacities.  Patient continuedto have significant hypoxemia on ambulation, oxygen saturation 82% on 6 L/min per [place] on ambulation.  Slowly his symptoms and level of energy have improved, able to ambulate with physical therapy with supplemental oxygen.   1.  Acute hypoxic respiratory failure due to SARS COVID-19 viral pneumonia. Patient was admitted to the medical ward, he received supplemental oxygen per nasal cannula, aggressive medical therapy with systemic steroids, baricitinib and remdesivir. He received bronchodilator therapy, antitussive agents and airway clearing techniques with flutter valve and incentive spirometer.  His condition slowly improved, with decreased levels of dyspnea, decreased oxygen requirements and improved inflammatory markers. Further work-up with CT chest and ultrasonography of the lower extremities were negative for thromboembolism. At discharge he continued to require supplemental oxygen at rest and ambulation, he does have extensive lung injury related to acute SARS COVID-19 viral pneumonia, evidence on CT chest.  Patient will continue slow taper of prednisone at discharge, and will plan to follow-up with outpatient pulmonary/post Covid.  COVID-19 Labs  Recent Labs (last 2 labs)        Recent Labs    05/25/20 0507 05/26/20 0523 05/27/20 0428  DDIMER 2.67* 2.21* 1.39*  FERRITIN 380* 409* 481*  CRP 3.7* 4.2* 2.4*      Recent Labs  No results found for:  [place]     2.  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   His kidney function was closely monitored during his hospitalization.  Potassium was corrected.  At discharge patient is tolerating p.o. diet adequately. Discharge sodium 135, potassium 4.7, chloride 90, bicarb 33 glucose 128, BUN 34, creatinine 1.36. Continue amiloride and potassium supplementation.  3.  Hypertension.  Continue amiloride.  4.  Anxiety.  Patient received alprazolam as needed for anxiety.  He will continue this regimen at discharge.    Discharge Diagnoses:  Principal Problem:   Acute respiratory failure due to COVID-19 [facility])   HTN (hypertension)   Bartters syndrome ([place])   History of panic attacks   Hyperlipidemia   Anxiety      History of Present Illness  06/27/2020  Pulmonary/ 1st office eval/Wert  Off 02 x 3 weeks / Prednisone 10 mg one half daily to complete rx 07/02/20  Chief Complaint  Patient presents with  . Consult    Covid Sept 2021, SOB with activity   Dyspnea:  [name] and half  Walking but only moderate pace. sats low 90's walking more briskly hasn't tried jogging  Cough: mucus is white / minimal volume Sleep: flat bed on R side/ one pillow   SABA use: none  rec To get the most out of exercise, you need to be continuously aware that you are short of breath, but never out of breath, for 30 minutes daily. As you improve, it will actually be easier for you to do the same amount of exercise  in  30 minutes so always push to the level where you are short of breath.   Make sure you check your oxygen saturations at highest level of activity   11/11/2020  f/u ov/Wert re:  Post covid doe  Chief Complaint  Patient presents with  . Follow-up    No issue breathing   Dyspnea:  Treadmill 30 min fast walk, no tilt but uses wts when on it / sats in mid 90s never below 94% even at peak Cough: none  Sleeping: no resp cc's SABA use: none  02: none  Covid status:   1st dose pfizer 10/01/20 - planning second, tol well    No obvious day to day or daytime  variability or assoc excess/ purulent sputum or mucus plugs or hemoptysis or cp or chest tightness, subjective wheeze or overt sinus or hb symptoms.   Sleeping  without nocturnal  or early am exacerbation  of respiratory  c/o's or need for noct saba. Also denies any obvious fluctuation of symptoms with weather or environmental changes or other aggravating or alleviating factors except as outlined above   No unusual exposure hx or h/o childhood pna/ asthma or knowledge of premature birth.  Current Allergies, Complete Past Medical History, Past Surgical History, Family History, and Social History were reviewed in [employer] record.  ROS  The following are not active complaints unless bolded Hoarseness, sore throat, dysphagia, dental problems, itching, sneezing,  nasal congestion or discharge of excess mucus or purulent secretions, ear ache,   fever, chills, sweats, unintended wt loss or wt gain, classically pleuritic or exertional cp,  orthopnea pnd or arm/hand swelling  or leg swelling, presyncope, palpitations, abdominal pain, anorexia, nausea, vomiting, diarrhea  or change in bowel habits or change in bladder habits, change in stools or change in urine, dysuria, hematuria,  rash, arthralgias, visual complaints, headache, numbness, weakness or ataxia or problems with walking or coordination,  change in mood or  memory.        Current Meds  Medication Sig  . ALPRAZolam (XANAX) 1 MG tablet Take 1 tablet (1 mg total) by mouth 3 (three) times daily as needed for anxiety.  Marland Kitchen aMILoride (MIDAMOR) 5 MG tablet Take 5 mg by mouth daily.  Marland Kitchen ibuprofen (ADVIL) 200 MG tablet Take 400 mg by mouth every 6 (six) hours as needed (pain).  . Magnesium Cl-Calcium Carbonate (SLOW-MAG PO) Take 4 tablets by mouth 3 (three) times daily.  . Omega-3 Fatty Acids (FISH OIL TRIPLE STRENGTH PO) Take 1 capsule by mouth 2 (two) times daily.   . potassium chloride SA (KLOR-CON) 20 MEQ tablet Take 60 mEq by  mouth 3 (three) times daily.   .                    Past Medical History:  Diagnosis Date  . Anxiety   . Cholelithiases 12/21/2017   By xray; asymptomatic. Owens Loffler, MD GI [phone]  . Hyperlipidemia   . Hypertension   . Hypokalemia   . IBS (irritable bowel syndrome)      Objective:     Wt Readings from Last 3 Encounters:  11/11/20 166 lb (75.3 kg)  06/28/20 164 lb 12.8 oz (74.8 kg)  06/27/20 164 lb 3.2 oz (74.5 kg)      Vital signs reviewed  11/11/2020  - Note at rest 02 sats  100% on RA   Healthy amb male nad   HEENT : pt wearing mask not removed for exam due to covid -19 concerns.    NECK :  without JVD/Nodes/TM/ nl carotid upstrokes bilaterally   LUNGS: no acc muscle use,  Nl contour chest with slightly coarsened bs  bilaterally without cough on insp or exp maneuvers   CV:  RRR  no s3 or murmur or increase in P2, and no edema   ABD:  soft and nontender with nl inspiratory excursion in the supine position. No bruits or organomegaly appreciated, bowel sounds nl  MS:  Nl gait/ ext warm without deformities, calf tenderness, cyanosis or clubbing No obvious joint restrictions   SKIN: warm and dry without lesions    NEURO:  alert, approp, nl sensorium with  no motor or cerebellar deficits apparent.       CXR PA and Lateral:   11/11/2020 :    I personally reviewed images and agree with radiology impression as follows: No active cardiopulmonary disease. Suspected post inflammatory scarring in the left upper lung.  My review: marked serial improvement             Assessment

## 2020-11-11 NOTE — Patient Instructions (Addendum)
Please remember to go to the  x-ray department  for your tests - we will call you with the results when they are available    To get the most out of exercise, you need to be continuously aware that you are short of breath, but never out of breath, for at least 30 minutes daily. As you improve, it will actually be easier for you to do the same amount of exercise  in  30 minutes so always push to the level where you are short of breath.  Once you can do this, push for longer duration or repeat it after at least 4 hours of rest.  Make sure you check your oxygen saturations at highest level of activity  Follow up is as needed

## 2020-11-12 NOTE — Progress Notes (Signed)
Spoke with pt and notified of results per Dr. Wert. Pt verbalized understanding and denied any questions. 

## 2020-11-13 ENCOUNTER — Encounter: Payer: Self-pay | Admitting: Internal Medicine

## 2020-11-13 NOTE — Assessment & Plan Note (Signed)
Onset of symptoms 05/12/20  see admit 05/19/20  rx steroids/  baricitinib and remdesivir. - prolonged pred taper to end 07/02/20   Marked serial improvement clinically and radiographically, no further f/u needed          Each maintenance medication was reviewed in detail including emphasizing most importantly the difference between maintenance and prns and under what circumstances the prns are to be triggered using an action plan format where appropriate.  Total time for H and P, chart review, counseling,  and generating customized AVS unique to this final summary f/u  office visit / same day charting =  24 min

## 2020-11-21 DIAGNOSIS — U071 COVID-19: Secondary | ICD-10-CM | POA: Diagnosis not present

## 2020-11-25 DIAGNOSIS — Z20828 Contact with and (suspected) exposure to other viral communicable diseases: Secondary | ICD-10-CM | POA: Diagnosis not present

## 2020-11-27 DIAGNOSIS — Z20828 Contact with and (suspected) exposure to other viral communicable diseases: Secondary | ICD-10-CM | POA: Diagnosis not present

## 2020-12-02 ENCOUNTER — Other Ambulatory Visit (HOSPITAL_COMMUNITY): Payer: Self-pay

## 2020-12-02 MED FILL — Potassium Chloride Microencapsulated Crys ER Tab 20 mEq: ORAL | 30 days supply | Qty: 270 | Fill #0 | Status: CN

## 2020-12-04 ENCOUNTER — Other Ambulatory Visit (HOSPITAL_COMMUNITY): Payer: Self-pay

## 2020-12-11 ENCOUNTER — Other Ambulatory Visit (HOSPITAL_COMMUNITY): Payer: Self-pay

## 2020-12-11 MED FILL — Amiloride HCl Tab 5 MG: ORAL | 30 days supply | Qty: 30 | Fill #0 | Status: AC

## 2020-12-13 ENCOUNTER — Other Ambulatory Visit (HOSPITAL_COMMUNITY): Payer: Self-pay

## 2020-12-22 DIAGNOSIS — U071 COVID-19: Secondary | ICD-10-CM | POA: Diagnosis not present

## 2021-01-05 MED FILL — Potassium Chloride Microencapsulated Crys ER Tab 20 mEq: ORAL | 30 days supply | Qty: 270 | Fill #0 | Status: AC

## 2021-01-06 ENCOUNTER — Other Ambulatory Visit (HOSPITAL_COMMUNITY): Payer: Self-pay

## 2021-01-09 ENCOUNTER — Other Ambulatory Visit (HOSPITAL_COMMUNITY): Payer: Self-pay

## 2021-01-09 DIAGNOSIS — E876 Hypokalemia: Secondary | ICD-10-CM | POA: Diagnosis not present

## 2021-01-09 MED FILL — Amiloride HCl Tab 5 MG: ORAL | 30 days supply | Qty: 30 | Fill #1 | Status: AC

## 2021-01-17 DIAGNOSIS — Z8616 Personal history of COVID-19: Secondary | ICD-10-CM | POA: Diagnosis not present

## 2021-01-17 DIAGNOSIS — E876 Hypokalemia: Secondary | ICD-10-CM | POA: Diagnosis not present

## 2021-01-17 DIAGNOSIS — R7989 Other specified abnormal findings of blood chemistry: Secondary | ICD-10-CM | POA: Diagnosis not present

## 2021-02-05 ENCOUNTER — Other Ambulatory Visit (HOSPITAL_COMMUNITY): Payer: Self-pay

## 2021-02-05 MED FILL — Potassium Chloride Microencapsulated Crys ER Tab 20 mEq: ORAL | 30 days supply | Qty: 270 | Fill #1 | Status: AC

## 2021-02-12 ENCOUNTER — Other Ambulatory Visit (HOSPITAL_COMMUNITY): Payer: Self-pay

## 2021-02-12 MED FILL — Amiloride HCl Tab 5 MG: ORAL | 30 days supply | Qty: 30 | Fill #2 | Status: AC

## 2021-03-11 ENCOUNTER — Other Ambulatory Visit: Payer: Self-pay

## 2021-03-11 ENCOUNTER — Other Ambulatory Visit (HOSPITAL_COMMUNITY): Payer: Self-pay

## 2021-03-12 ENCOUNTER — Other Ambulatory Visit (HOSPITAL_COMMUNITY): Payer: Self-pay

## 2021-03-12 ENCOUNTER — Other Ambulatory Visit: Payer: Self-pay | Admitting: Family Medicine

## 2021-03-12 MED ORDER — POTASSIUM CHLORIDE CRYS ER 20 MEQ PO TBCR
EXTENDED_RELEASE_TABLET | Freq: Three times a day (TID) | ORAL | 5 refills | Status: DC
  Filled 2021-03-12 – 2021-12-04 (×2): qty 270, 90d supply, fill #0
  Filled 2022-01-08: qty 270, 90d supply, fill #1
  Filled 2022-01-10: qty 90, 30d supply, fill #1

## 2021-03-12 MED ORDER — POTASSIUM CHLORIDE CRYS ER 20 MEQ PO TBCR
EXTENDED_RELEASE_TABLET | ORAL | 11 refills | Status: DC
Start: 2021-03-12 — End: 2023-08-04
  Filled 2021-03-12: qty 270, 30d supply, fill #0
  Filled 2021-04-11: qty 270, 30d supply, fill #1
  Filled 2021-05-16: qty 270, 30d supply, fill #2
  Filled 2021-06-19: qty 270, 30d supply, fill #3
  Filled 2021-07-14: qty 270, 30d supply, fill #4
  Filled 2021-08-21: qty 270, 30d supply, fill #5
  Filled 2021-09-26: qty 270, 30d supply, fill #6
  Filled 2021-10-31: qty 270, 30d supply, fill #7
  Filled 2022-01-10 (×2): qty 270, 30d supply, fill #8

## 2021-03-16 MED FILL — Amiloride HCl Tab 5 MG: ORAL | 30 days supply | Qty: 30 | Fill #3 | Status: AC

## 2021-03-17 ENCOUNTER — Other Ambulatory Visit (HOSPITAL_COMMUNITY): Payer: Self-pay

## 2021-03-24 ENCOUNTER — Ambulatory Visit: Payer: 59 | Admitting: Family Medicine

## 2021-03-24 ENCOUNTER — Other Ambulatory Visit: Payer: Self-pay | Admitting: Family Medicine

## 2021-03-24 ENCOUNTER — Other Ambulatory Visit: Payer: Self-pay

## 2021-03-24 ENCOUNTER — Encounter: Payer: Self-pay | Admitting: Family Medicine

## 2021-03-24 VITALS — BP 120/78 | HR 79 | Temp 96.5°F | Resp 18 | Wt 176.4 lb

## 2021-03-24 DIAGNOSIS — N62 Hypertrophy of breast: Secondary | ICD-10-CM

## 2021-03-24 DIAGNOSIS — N644 Mastodynia: Secondary | ICD-10-CM

## 2021-03-24 DIAGNOSIS — E2681 Bartter's syndrome: Secondary | ICD-10-CM

## 2021-03-24 LAB — TSH: TSH: 3.68 u[IU]/mL (ref 0.35–5.50)

## 2021-03-24 NOTE — Progress Notes (Signed)
Subjective  CC:  Chief Complaint  Patient presents with   Breast Problem    Sore to touch    HPI: David Martin is a 45 y.o. male who presents to the office today to address the problems listed above in the chief complaint. 45 year old male presents due to approximately 4 weeks of tenderness and discomfort beneath the areola of his left breast.  He notes that it feels fuller but no definite mass.  No nipple discharge.  Reports tenderness if he pushes directly over the area but also notices some stinging sensations or heaviness while he is running on the treadmill for example.  If one of his children bumps into that area, and it is also tender.  He denies trauma, bruising or similar problems in the past.  He feels that his breast tissue is symmetric.  No family history of breast cancer.  He denies taking over-the-counter supplements or hormone treatments.  Assessment  1. Gynecomastia, male   2. Tenderness of nipple   3. 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  We will check lab work and ultrasound.  No obvious offending medications.  Reassured.  Follow up: Return in about 3 months (around 06/24/2021) for complete physical.  Visit date not found  Orders Placed This Encounter  Procedures   US BREAST LTD UNI LEFT INC AXILLA   TSH   Prolactin   Testosterone,Free and Total   No orders of the defined types were placed in this encounter.     I reviewed the patients updated PMH, FH, and SocHx.    Patient Active Problem List   Diagnosis Date Noted   Acute respiratory failure due to COVID-19 ([place]) 05/19/2020   Hyperlipidemia 05/19/2020   Anxiety 05/19/2020   Pneumonia due to COVID-19 virus    Dermatitis of ear canal, left 10/20/2019   Eustachian tube dysfunction, left 10/20/2019   Cholelithiases 12/21/2017   History of panic  attacks 12/21/2017   Conductive hearing loss 10/04/2017   Bartters syndrome ([place]) 01/06/2016   Refusal of statin medication by patient 01/06/2016   HTN (hypertension) 11/08/2010   IRRITABLE BOWEL SYNDROME 04/23/2008   Current Meds  Medication Sig   ALPRAZolam (XANAX) 1 MG tablet Take 1 tablet (1 mg total) by mouth 3 (three) times daily as needed for anxiety.   aMILoride (MIDAMOR) 5 MG tablet TAKE 1 TABLET BY MOUTH ONCE A DAY   Magnesium Cl-Calcium Carbonate (SLOW-MAG PO) Take 4 tablets by mouth 3 (three) times daily.   Omega-3 Fatty Acids (FISH OIL TRIPLE STRENGTH PO) Take 1 capsule by mouth 2 (two) times daily.    potassium chloride SA (KLOR-CON M20) 20 MEQ tablet Take 3 tablets by mouth three times daily    Allergies: Patient has No Known Allergies. Family History: Patient family history includes Dementia in his maternal aunt, maternal uncle, and mother; Diabetes in his paternal grandfather and paternal grandmother; Hyperlipidemia in his paternal grandfather and paternal grandmother; Hypertension in his father. Social History:  Patient  reports that he has never smoked. He has never used smokeless tobacco. He reports that he does not drink alcohol and does not use drugs.  Review of Systems: Constitutional: Negative for fever malaise or anorexia Cardiovascular: negative for chest pain Respiratory: negative for SOB or persistent cough Gastrointestinal: negative for abdominal pain Endocrine: Denies palpitations, sweats, tremor or anxiety.  Objective  Vitals: BP 120/78   Pulse 79   Temp (!) 96.5 F (35.8 C) (Temporal)   Resp 18   Wt 176 lb 6.4 oz (80 kg)   SpO2 99%   BMI 25.31 kg/m  General: no acute distress , A&Ox3 Breast: Bilateral mild gynecomastia, beneath left areola, fullness without definite or discrete mass.  Minimal tenderness.  No nipple discharge.  No axillary lymphadenopathy.    Commons side effects, risks, benefits, and alternatives for medications and  treatment plan prescribed today were discussed, and the patient expressed understanding of the given instructions. Patient is instructed to call or message via MyChart if he/she has any questions or concerns regarding our treatment plan. No barriers to understanding were identified. We discussed Red Flag symptoms and signs in detail. Patient expressed understanding regarding what to do in case of urgent or emergency type symptoms.  Medication list was reconciled, printed and provided to the patient in AVS. Patient instructions and summary information was reviewed with the patient as documented in the AVS. This note was prepared with assistance of Dragon voice recognition software. Occasional wrong-word or sound-a-like substitutions may have occurred due to the inherent limitations of voice recognition software  This visit occurred during the SARS-CoV-2 public health emergency.  Safety protocols were in place, including screening questions prior to the visit, additional usage of staff PPE, and extensive cleaning of exam room while observing appropriate contact time as indicated for disinfecting solutions.

## 2021-03-24 NOTE — Patient Instructions (Addendum)
Please return for your annual complete physical; please come fasting.   I will release your lab results to you on your MyChart account with further instructions. Please reply with any questions.    We will call you to get an ultrasound set up.   If you have any questions or concerns, please don't hesitate to send me a message via MyChart or call the office at [phone]. Thank you for visiting with [place] today! It's our pleasure caring for you.   Gynecomastia, Adult Gynecomastia is an overgrowth of gland tissue in a man's breasts. This may cause one or both breasts to become enlarged. The condition often develops in men who have an imbalance of the male sex hormone (testosterone) and the male sex hormone (estrogen). This means that a man may have too much estrogen, too little testosterone, or both. Gynecomastia may be a normal part of aging for some men. It can alsohappen to adolescent boys during puberty. What are the causes? This condition may be caused by: Certain medicines, such as: Estrogen supplements and medicines that act like estrogen in the body. Medicines that keep testosterone from functioning normally in the body (testosterone-inhibiting drugs). Anabolic steroids. Medicines to treat heartburn, cancer, heart disease, mental health problems, HIV, or AIDS. Antibiotic medicine. Chemotherapy medicine. Recreational drugs, including alcohol, marijuana, and opioids. Herbal products, including lavender and tea tree oil. A gene that is passed from parent to child (inherited). Certain medical conditions, such as: Tumors in the pituitary or adrenal gland. An overactive thyroid gland. Certain inherited disorders, including a genetic disease that causes low testosterone in males (Klinefelter syndrome). Cancer of the lung, kidney, liver, testicle, or gastrointestinal tract. Conditions that cause liver or kidney failure. Poor nutrition and starvation. Testicle shrinking or failure  (testicularatrophy). In some cases, the cause may not be known. What increases the risk? The following factors may make you more likely to develop this condition: Being 71 years old or older. Being overweight. Abusing alcohol or other drugs. Having a family history of gynecomastia. What are the signs or symptoms? In most cases, breast enlargement is the only symptom. The enlargement may start near the nipple, and the breast tissue may feel firm and rubbery. Other symptoms may include: Pain or tenderness in the breasts. Itchy breasts. How is this diagnosed? This condition may be diagnosed based on: Your symptoms and medical history. A physical exam. Imaging tests, such as: An ultrasound. A mammogram. An MRI. Blood tests. Removal of a sample of breast tissue to be tested in a lab (biopsy). How is this treated? This condition may go away on its own, without treatment. If gynecomastia is caused by a medical problem or drug abuse, treatment may include: Getting treatment for the underlying medical problem or for drug abuse. Changing or stopping medicines. Medicines to block the effects of estrogen. Taking a testosterone replacement. Surgery to remove breast tissue or any lumps in your breasts. Breast reduction surgery. This may be an option if you have severe or painful gynecomastia. Follow these instructions at home:  Take over-the-counter and prescription medicines only as told by your health care provider. Talk to your health care provider before taking any herbal medicines or diet supplements. Do not abuse drugs or alcohol. Keep all follow-up visits as told by your health care provider. This is important. Contact a health care provider if: Your breast tissue grows larger or gets more swollen or painful. You have a lump in your testicle. You have blood or discharge coming from your  nipples. Your nipple changes shape. You develop a hard or painful lump in your  breast. Summary Gynecomastia is an overgrowth of gland tissue in a man's breasts. This may cause one or both breasts to become enlarged. In most cases, breast enlargement is the only symptom. The enlargement may start near the nipple, and the breast tissue may feel firm and rubbery. This condition may go away on its own, without treatment. In some cases, treatment for an underlying medical problem or for drug abuse may be needed. Take over-the-counter and prescription medicines only as told by your health care provider. Do not abuse drugs or alcohol. This information is not intended to replace advice given to you by your health care provider. Make sure you discuss any questions you have with your healthcare provider. Document Revised: 02/09/2019 Document Reviewed: 02/09/2019 Elsevier Patient Education  [place].

## 2021-03-25 ENCOUNTER — Ambulatory Visit: Payer: 59 | Admitting: Family Medicine

## 2021-03-25 LAB — PROLACTIN: Prolactin: 16.2 ng/mL (ref 2.0–18.0)

## 2021-03-26 ENCOUNTER — Encounter: Payer: Self-pay | Admitting: Family Medicine

## 2021-03-26 LAB — TESTOSTERONE,FREE AND TOTAL
Testosterone, Free: 6 pg/mL — ABNORMAL LOW (ref 6.8–21.5)
Testosterone: 103 ng/dL — ABNORMAL LOW (ref 264–916)

## 2021-03-27 ENCOUNTER — Telehealth: Payer: Self-pay

## 2021-03-27 NOTE — Telephone Encounter (Signed)
Patient states he was referred to GI for an ultrasound.  States he can not get in with them for a month.  Would like to know if he can get in sooner with a hospital sooner?

## 2021-03-28 ENCOUNTER — Ambulatory Visit: Payer: 59

## 2021-03-28 ENCOUNTER — Other Ambulatory Visit: Payer: Self-pay

## 2021-03-28 ENCOUNTER — Ambulatory Visit
Admission: RE | Admit: 2021-03-28 | Discharge: 2021-03-28 | Disposition: A | Discharge status: Home / Self Care | Payer: 59 | Source: Ambulatory Visit | Attending: Family Medicine | Admitting: Family Medicine

## 2021-03-28 DIAGNOSIS — N62 Hypertrophy of breast: Secondary | ICD-10-CM

## 2021-03-28 DIAGNOSIS — N644 Mastodynia: Secondary | ICD-10-CM

## 2021-03-28 DIAGNOSIS — R922 Inconclusive mammogram: Secondary | ICD-10-CM | POA: Diagnosis not present

## 2021-04-01 ENCOUNTER — Encounter: Payer: Self-pay | Admitting: Family Medicine

## 2021-04-01 DIAGNOSIS — N62 Hypertrophy of breast: Secondary | ICD-10-CM | POA: Insufficient documentation

## 2021-04-01 DIAGNOSIS — R7989 Other specified abnormal findings of blood chemistry: Secondary | ICD-10-CM | POA: Insufficient documentation

## 2021-04-03 ENCOUNTER — Encounter: Payer: Self-pay | Admitting: Family Medicine

## 2021-04-07 ENCOUNTER — Other Ambulatory Visit (HOSPITAL_COMMUNITY): Payer: Self-pay

## 2021-04-11 ENCOUNTER — Other Ambulatory Visit (HOSPITAL_COMMUNITY): Payer: Self-pay

## 2021-04-14 ENCOUNTER — Other Ambulatory Visit (HOSPITAL_COMMUNITY): Payer: Self-pay

## 2021-04-18 ENCOUNTER — Other Ambulatory Visit (HOSPITAL_COMMUNITY): Payer: Self-pay

## 2021-04-18 MED FILL — Amiloride HCl Tab 5 MG: ORAL | 30 days supply | Qty: 30 | Fill #4 | Status: AC

## 2021-04-29 ENCOUNTER — Other Ambulatory Visit: Payer: 59

## 2021-05-02 NOTE — Telephone Encounter (Signed)
Please see message and advise 

## 2021-05-06 NOTE — Telephone Encounter (Signed)
Lattie Haw, no further imaging is needed at this time per [name].

## 2021-05-06 NOTE — Telephone Encounter (Signed)
Spoke to pt told him per [name], Pt had mammogram showing gynecomastia. No other concerning findings were noted and no further imaging is needed at this time. So you do not need an U/S done. Pt verbalized understanding.

## 2021-05-16 ENCOUNTER — Other Ambulatory Visit: Payer: Self-pay

## 2021-05-16 ENCOUNTER — Other Ambulatory Visit (HOSPITAL_COMMUNITY): Payer: Self-pay

## 2021-05-19 ENCOUNTER — Other Ambulatory Visit (HOSPITAL_COMMUNITY): Payer: Self-pay

## 2021-05-19 MED ORDER — AMILORIDE HCL 5 MG PO TABS
5.0000 mg | ORAL_TABLET | Freq: Every day | ORAL | 6 refills | Status: DC
  Filled 2021-05-19: qty 30, 30d supply, fill #0
  Filled 2021-06-19: qty 30, 30d supply, fill #1
  Filled 2021-07-14: qty 30, 30d supply, fill #2
  Filled 2021-08-18: qty 30, 30d supply, fill #3
  Filled 2021-09-21: qty 30, 30d supply, fill #4
  Filled 2021-10-20: qty 30, 30d supply, fill #5
  Filled 2021-11-21: qty 30, 30d supply, fill #6

## 2021-05-20 ENCOUNTER — Other Ambulatory Visit (HOSPITAL_COMMUNITY): Payer: Self-pay

## 2021-06-19 ENCOUNTER — Other Ambulatory Visit (HOSPITAL_COMMUNITY): Payer: Self-pay

## 2021-06-24 ENCOUNTER — Ambulatory Visit (INDEPENDENT_AMBULATORY_CARE_PROVIDER_SITE_OTHER): Payer: 59 | Admitting: Family Medicine

## 2021-06-24 ENCOUNTER — Other Ambulatory Visit (HOSPITAL_COMMUNITY): Payer: Self-pay

## 2021-06-24 ENCOUNTER — Encounter: Payer: Self-pay | Admitting: Family Medicine

## 2021-06-24 ENCOUNTER — Other Ambulatory Visit: Payer: Self-pay

## 2021-06-24 VITALS — BP 126/78 | HR 73 | Temp 97.3°F | Ht 70.0 in | Wt 174.6 lb

## 2021-06-24 DIAGNOSIS — E2681 Bartter's syndrome: Secondary | ICD-10-CM

## 2021-06-24 DIAGNOSIS — Z0000 Encounter for general adult medical examination without abnormal findings: Secondary | ICD-10-CM

## 2021-06-24 DIAGNOSIS — Z8659 Personal history of other mental and behavioral disorders: Secondary | ICD-10-CM

## 2021-06-24 LAB — CBC WITH DIFFERENTIAL/PLATELET
Basophils Absolute: 0.1 10*3/uL (ref 0.0–0.1)
Basophils Relative: 1.1 % (ref 0.0–3.0)
Eosinophils Absolute: 0.1 10*3/uL (ref 0.0–0.7)
Eosinophils Relative: 1.1 % (ref 0.0–5.0)
HCT: 41.9 % (ref 39.0–52.0)
Hemoglobin: 13.5 g/dL (ref 13.0–17.0)
Lymphocytes Relative: 26.4 % (ref 12.0–46.0)
Lymphs Abs: 2 10*3/uL (ref 0.7–4.0)
MCHC: 32.3 g/dL (ref 30.0–36.0)
MCV: 78.4 fl (ref 78.0–100.0)
Monocytes Absolute: 0.7 10*3/uL (ref 0.1–1.0)
Monocytes Relative: 8.9 % (ref 3.0–12.0)
Neutro Abs: 4.6 10*3/uL (ref 1.4–7.7)
Neutrophils Relative %: 62.5 % (ref 43.0–77.0)
Platelets: 260 10*3/uL (ref 150.0–400.0)
RBC: 5.34 Mil/uL (ref 4.22–5.81)
RDW: 14 % (ref 11.5–15.5)
WBC: 7.4 10*3/uL (ref 4.0–10.5)

## 2021-06-24 LAB — COMPREHENSIVE METABOLIC PANEL
ALT: 42 U/L (ref 0–53)
AST: 25 U/L (ref 0–37)
Albumin: 4.8 g/dL (ref 3.5–5.2)
Alkaline Phosphatase: 49 U/L (ref 39–117)
BUN: 14 mg/dL (ref 6–23)
CO2: 31 mEq/L (ref 19–32)
Calcium: 9.5 mg/dL (ref 8.4–10.5)
Chloride: 102 mEq/L (ref 96–112)
Creatinine, Ser: 1.28 mg/dL (ref 0.40–1.50)
GFR: 67.87 mL/min (ref >60.00)
Glucose, Bld: 106 mg/dL — ABNORMAL HIGH (ref 70–99)
Potassium: 4.1 mEq/L (ref 3.5–5.1)
Sodium: 142 mEq/L (ref 135–145)
Total Bilirubin: 0.5 mg/dL (ref 0.2–1.2)
Total Protein: 7.3 g/dL (ref 6.0–8.3)

## 2021-06-24 LAB — LIPID PANEL
Cholesterol: 240 mg/dL — ABNORMAL HIGH (ref 0–200)
HDL: 64.9 mg/dL (ref >39.00)
LDL Cholesterol: 138 mg/dL — ABNORMAL HIGH (ref 0–99)
NonHDL: 175.49
Total CHOL/HDL Ratio: 4
Triglycerides: 187 mg/dL — ABNORMAL HIGH (ref 0.0–149.0)
VLDL: 37.4 mg/dL (ref 0.0–40.0)

## 2021-06-24 MED ORDER — ALPRAZOLAM 1 MG PO TABS
1.0000 mg | ORAL_TABLET | Freq: Every day | ORAL | 0 refills | Status: DC | PRN
  Filled 2021-06-24: qty 10, 10d supply, fill #0

## 2021-06-24 NOTE — Patient Instructions (Addendum)
Please return in 12 months for your annual complete physical; please come fasting.   I will release your lab results to you on your MyChart account with further instructions. Please reply with any questions.    If you have any questions or concerns, please don't hesitate to send me a message via MyChart or call the office at [phone]. Thank you for visiting with [place] today! It's our pleasure caring for you.   Please do these things to maintain good health!  Exercise at least 30-45 minutes a day,  4-5 days a week.  Eat a low-fat diet with lots of fruits and vegetables, up to 7-9 servings per day. Drink plenty of water daily. Try to drink 8 8oz glasses per day. Seatbelts can save your life. Always wear your seatbelt. Place Smoke Detectors on every level of your home and check batteries every year. Eye Doctor - have an eye exam every 1-2 years Safe sex - use condoms to protect yourself from STDs if you could be exposed to these types of infections. Avoid heavy alcohol use. If you drink, keep it to less than 2 drinks/day and not every day. [place].  Choose someone you trust that could speak for you if you became unable to speak for yourself. Depression is common in our stressful world.If you're feeling down or losing interest in things you normally enjoy, please come in for a visit.

## 2021-06-24 NOTE — Progress Notes (Signed)
Subjective  Chief Complaint  Patient presents with   Annual Exam   Anxiety   Hyperlipidemia    HPI: David Martin is a 45 y.o. male who presents to [facility] at [facility] today for a Male Wellness Visit. He also has the concerns and/or needs as listed above in the chief complaint. These will be addressed in addition to the Health Maintenance Visit.   Wellness Visit: annual visit with health maintenance review and exam   HM: screens and imms are up to date. Working out regularly and feeling well. No new concerns. Reviewed PMH and updated.   Body mass index is 25.05 kg/m. Wt Readings from Last 3 Encounters:  06/24/21 174 lb 9.6 oz (79.2 kg)  03/24/21 176 lb 6.4 oz (80 kg)  11/11/20 166 lb (75.3 kg)     Chronic disease management visit and/or acute problem visit: H/o Covid with respiratory failure: feels completely recovered. Exercises regulalry. No fatigue or sob Gynecomastia due to low T.  Rare panic/h/o anxiety; likes to have xanax on hand. Returns unused bottle from last year. No recent attacks. Mood is good.   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   IBS (irritable bowel syndrome) 04/23/2008   Health Maintenance  Topic Date Due   Pneumococcal Vaccine 31-54 Years old (1 - PCV) Never done   COVID-19 Vaccine (3 - Booster for Pfizer series) 01/24/2021   Hepatitis C Screening  03/24/2022 (Originally 07/20/1994)   TETANUS/TDAP  10/29/2025   INFLUENZA VACCINE  Completed   HIV Screening  Completed   HPV VACCINES  Aged Out   Immunization History  Administered Date(s) Administered   Influenza Inj Mdck Quad Pf 06/28/2019   Influenza Split 06/03/2012   Influenza  Whole 06/17/2010   Influenza,inj,Quad PF,6+ Mos 05/29/2016   Influenza-Unspecified 06/22/2021   PFIZER(Purple Top)SARS-COV-2 Vaccination 10/01/2020, 11/29/2020   Tdap 10/30/2015   We updated and reviewed the patient's past history in detail and it is documented below. Allergies: Patient has No Known Allergies. Past Medical History  has a past medical history of Anxiety, Cholelithiases (12/21/2017), Hypokalemia, and IBS (irritable bowel syndrome). Past Surgical History Patient  has no past surgical history on file. Social History Patient  reports that he has never smoked. He has never used smokeless tobacco. He reports that he does not drink alcohol and does not use drugs. Family History family history includes Dementia in his maternal aunt, maternal uncle, and mother; Diabetes in his paternal grandfather and paternal grandmother; Hyperlipidemia in his paternal grandfather and paternal grandmother; Hypertension in his father. Review of Systems: Constitutional: negative for fever or malaise Ophthalmic: negative for photophobia, double vision or loss of vision Cardiovascular: negative for chest pain, dyspnea on exertion, or new LE swelling Respiratory: negative for SOB or persistent cough Gastrointestinal: negative for abdominal pain, change in bowel habits or melena Genitourinary: negative for dysuria or gross hematuria Musculoskeletal: negative for new gait disturbance or muscular weakness Integumentary: negative for new or persistent rashes Neurological: negative for TIA or stroke symptoms Psychiatric: negative for SI or delusions Allergic/Immunologic: negative for hives  Patient Care Team    Relationship Specialty Notifications Start End  [name], MD PCP - General Family Medicine  12/21/17    Objective  Vitals: BP 126/78   Pulse 73   Temp (!) 97.3 F (36.3 C) (Temporal)   Ht 5\' 10"  (1.778 m)   Wt 174 lb 9.6 oz (79.2 kg)   SpO2 99%   BMI 25.05 kg/m  General:  Well  developed, well nourished, no acute distress  Psych:  Alert and orientedx3,normal mood and affect HEENT:  Normocephalic, atraumatic, non-icteric sclera, PERRL, oropharynx is clear without mass or exudate, supple neck without adenopathy, mass or thyromegaly Cardiovascular:  Normal S1, S2, RRR without gallop, rub or murmur, nondisplaced PMI, +2 distal pulses in bilateral upper and lower extremities. Respiratory:  Good breath sounds bilaterally, CTAB with normal respiratory effort Gastrointestinal: normal bowel sounds, soft, non-tender, no noted masses. No HSM MSK: no deformities, contusions. Joints are without erythema or swelling. Spine and CVA region are nontender Skin:  Warm, no rashes or suspicious lesions noted Neurologic:    Mental status is normal. Gross motor and sensory exams are normal. Stable gait. No tremor GU: No inguinal hernias or adenopathy are appreciated bilaterally   Assessment  1. Annual physical exam   2. 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 History of panic attacks      Plan  Male Wellness Visit: Age appropriate Health Maintenance and Prevention measures were discussed with patient. Included topics are cancer screening recommendations, ways to keep healthy (see AVS) including dietary and exercise recommendations, regular eye and dental care, use of seat belts, and avoidance of moderate alcohol use and tobacco use.  BMI: discussed patient's BMI and encouraged positive lifestyle modifications to help get to or maintain a target BMI. HM needs and immunizations were addressed and ordered. See below for orders. See HM and immunization section for updates. Routine labs and screening tests ordered including cmp, cbc and lipids where appropriate. Discussed recommendations regarding Vit D and calcium supplementation (see AVS)  Chronic disease f/u and/or acute problem visit: (deemed necessary to be done in addition to the wellness visit): Xanax reordered #10.    Follow up: No  follow-ups on file.  Commons side effects, risks, benefits, and alternatives for medications and treatment plan prescribed today were discussed, and the patient expressed understanding of the given instructions. Patient is instructed to call or message via MyChart if he/she has any questions or concerns regarding our treatment plan. No barriers to understanding were identified. We discussed Red Flag symptoms and signs in detail. Patient expressed understanding regarding what to do in case of urgent or emergency type symptoms.  Medication list was reconciled, printed and provided to the patient in AVS. Patient instructions and summary information was reviewed with the patient as documented in the AVS. This note was prepared with assistance of Dragon voice recognition software. Occasional wrong-word or sound-a-like substitutions may have occurred due to the inherent limitations of voice recognition software  This visit occurred during the SARS-CoV-2 public health emergency.  Safety protocols were in place, including screening questions prior to the visit, additional usage of staff PPE, and extensive cleaning of exam room while observing appropriate contact time as indicated for disinfecting solutions.   Orders Placed This Encounter  Procedures   CBC with Differential/Platelet   Comprehensive metabolic panel   Hepatitis C antibody   Lipid panel   Meds ordered this encounter  Medications   ALPRAZolam (XANAX) 1 MG tablet    Sig: Take 1 tablet by mouth daily as needed for anxiety.    Dispense:  10 tablet    Refill:  0

## 2021-06-25 LAB — HEPATITIS C ANTIBODY
Hepatitis C Ab: NONREACTIVE
SIGNAL TO CUT-OFF: 0.09 (ref <1.00)

## 2021-07-14 ENCOUNTER — Other Ambulatory Visit (HOSPITAL_COMMUNITY): Payer: Self-pay

## 2021-07-15 ENCOUNTER — Other Ambulatory Visit (HOSPITAL_COMMUNITY): Payer: Self-pay

## 2021-07-31 ENCOUNTER — Encounter: Payer: Self-pay | Admitting: Family Medicine

## 2021-08-18 ENCOUNTER — Other Ambulatory Visit (HOSPITAL_COMMUNITY): Payer: Self-pay

## 2021-08-21 ENCOUNTER — Other Ambulatory Visit (HOSPITAL_COMMUNITY): Payer: Self-pay

## 2021-09-21 IMAGING — DX DG CHEST 2V
3 series · 3 of 3 positions shown · non-contrast
Comparison: May 19, 2020

CLINICAL DATA: Dyspnea on exertion.

EXAM:
CHEST - 2 VIEW

[chest pa]
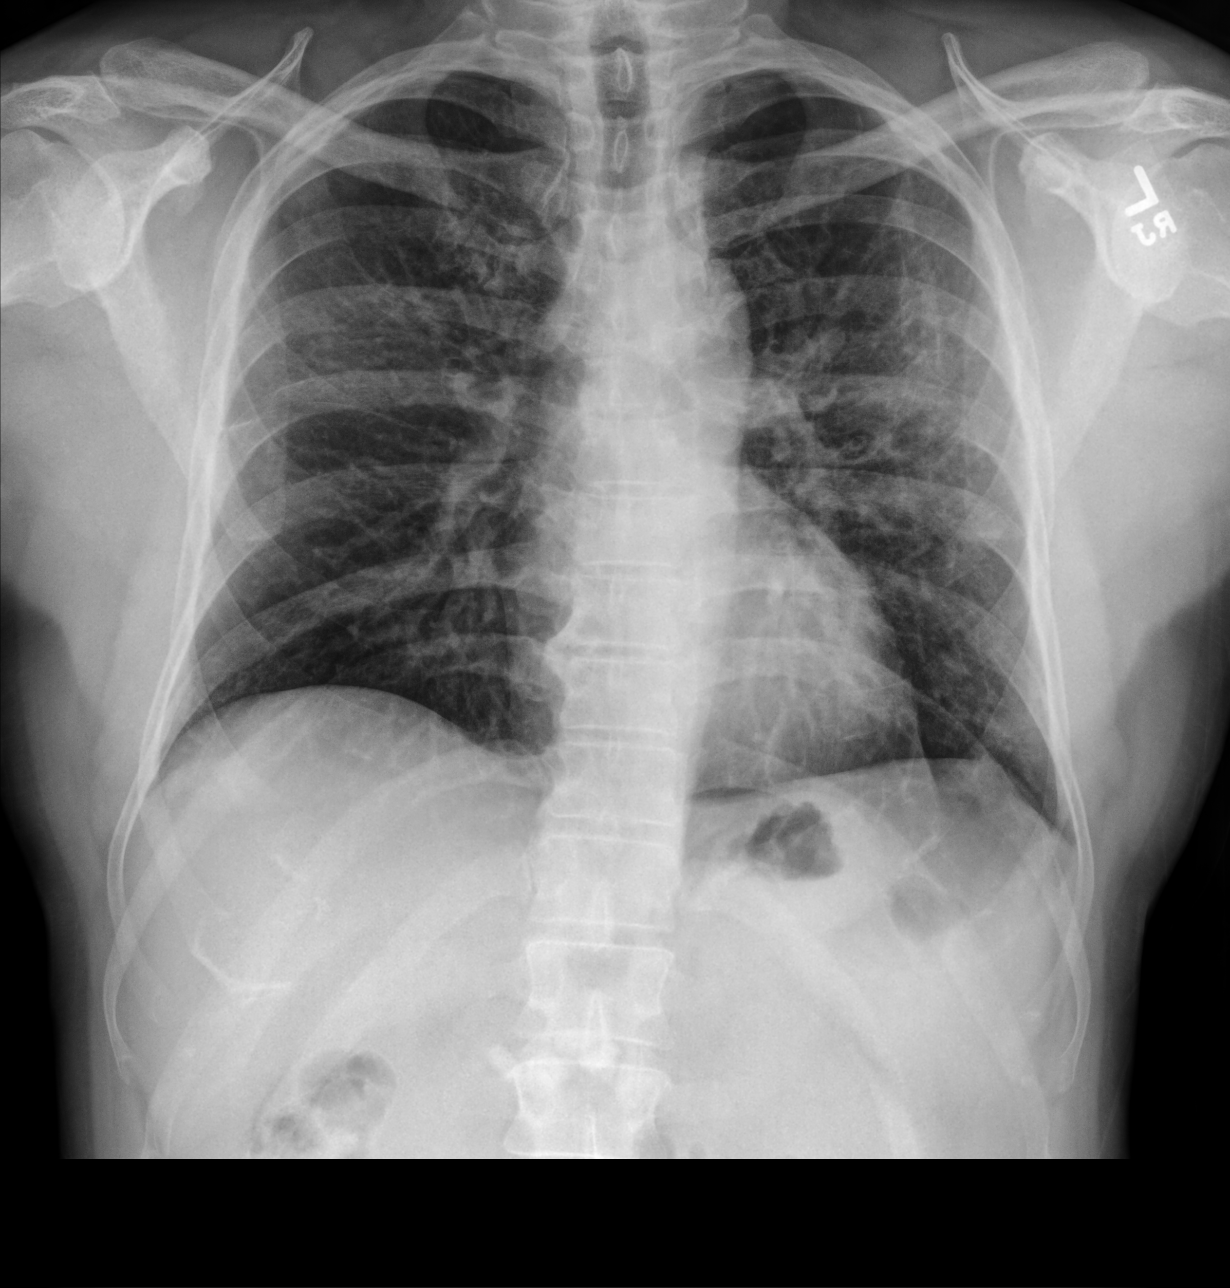

[chest lat (1 of 2)]
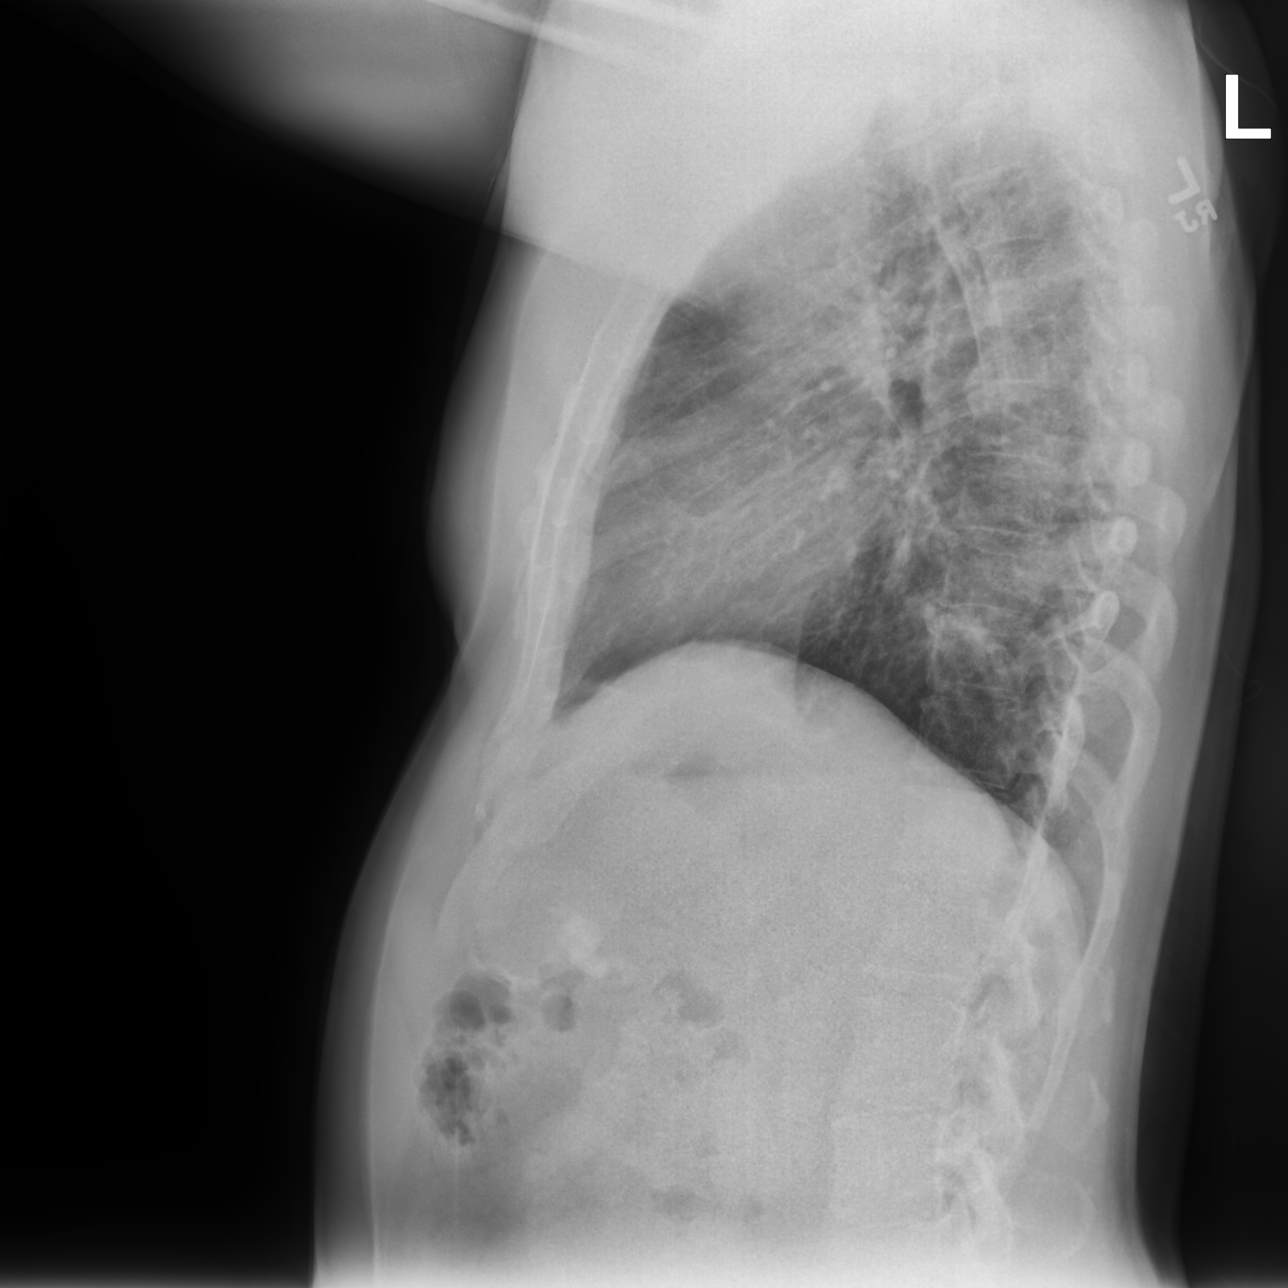

[chest lat (2 of 2)]
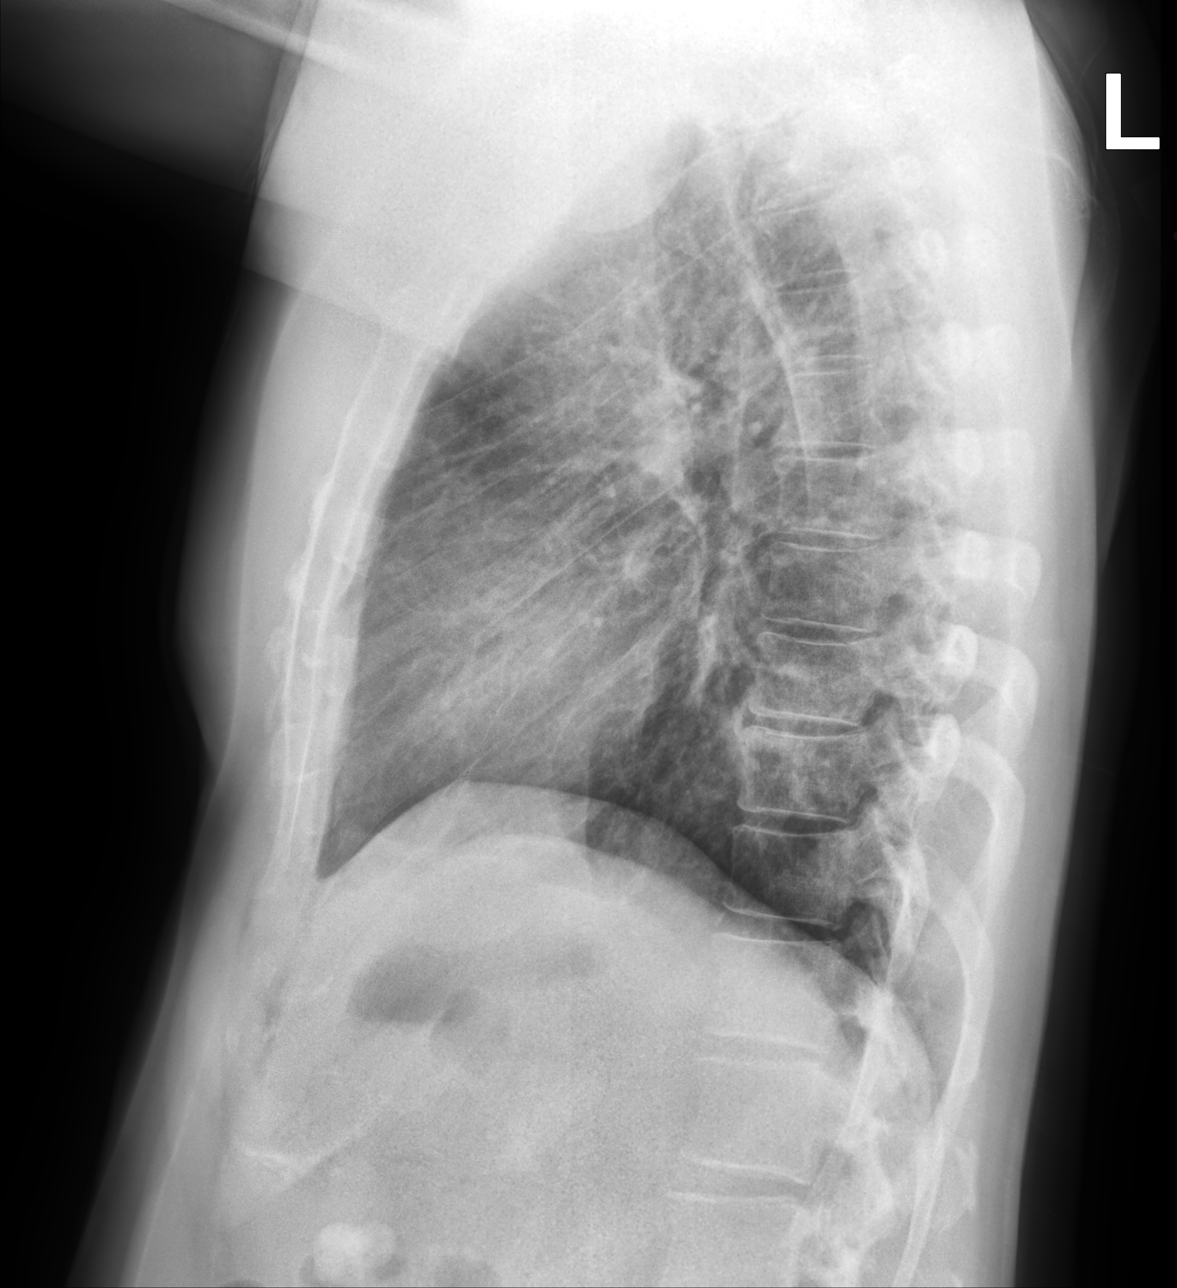

[3 of 3 positions shown; findings below may reference images not displayed]

FINDINGS: Mild hazy opacity in the periphery of the left mid lung. Possible
mild right perihilar opacity. The heart, hila, mediastinum, lungs,
and pleura are otherwise unremarkable.
IMPRESSION: Mild hazy opacities in the periphery of the left mid lung and right
perihilar region. Recommend follow-up to resolution.

## 2021-09-22 ENCOUNTER — Other Ambulatory Visit (HOSPITAL_COMMUNITY): Payer: Self-pay

## 2021-09-26 ENCOUNTER — Other Ambulatory Visit (HOSPITAL_COMMUNITY): Payer: Self-pay

## 2021-10-21 ENCOUNTER — Other Ambulatory Visit (HOSPITAL_COMMUNITY): Payer: Self-pay

## 2021-10-31 ENCOUNTER — Other Ambulatory Visit (HOSPITAL_COMMUNITY): Payer: Self-pay

## 2021-11-01 IMAGING — DX DG CHEST 2V
2 series · 2 of 2 positions shown · non-contrast
Comparison: Most recent radiograph 06/27/2020

CLINICAL DATA: Follow-up COVID pneumonia.

EXAM:
CHEST - 2 VIEW

[chest pa]
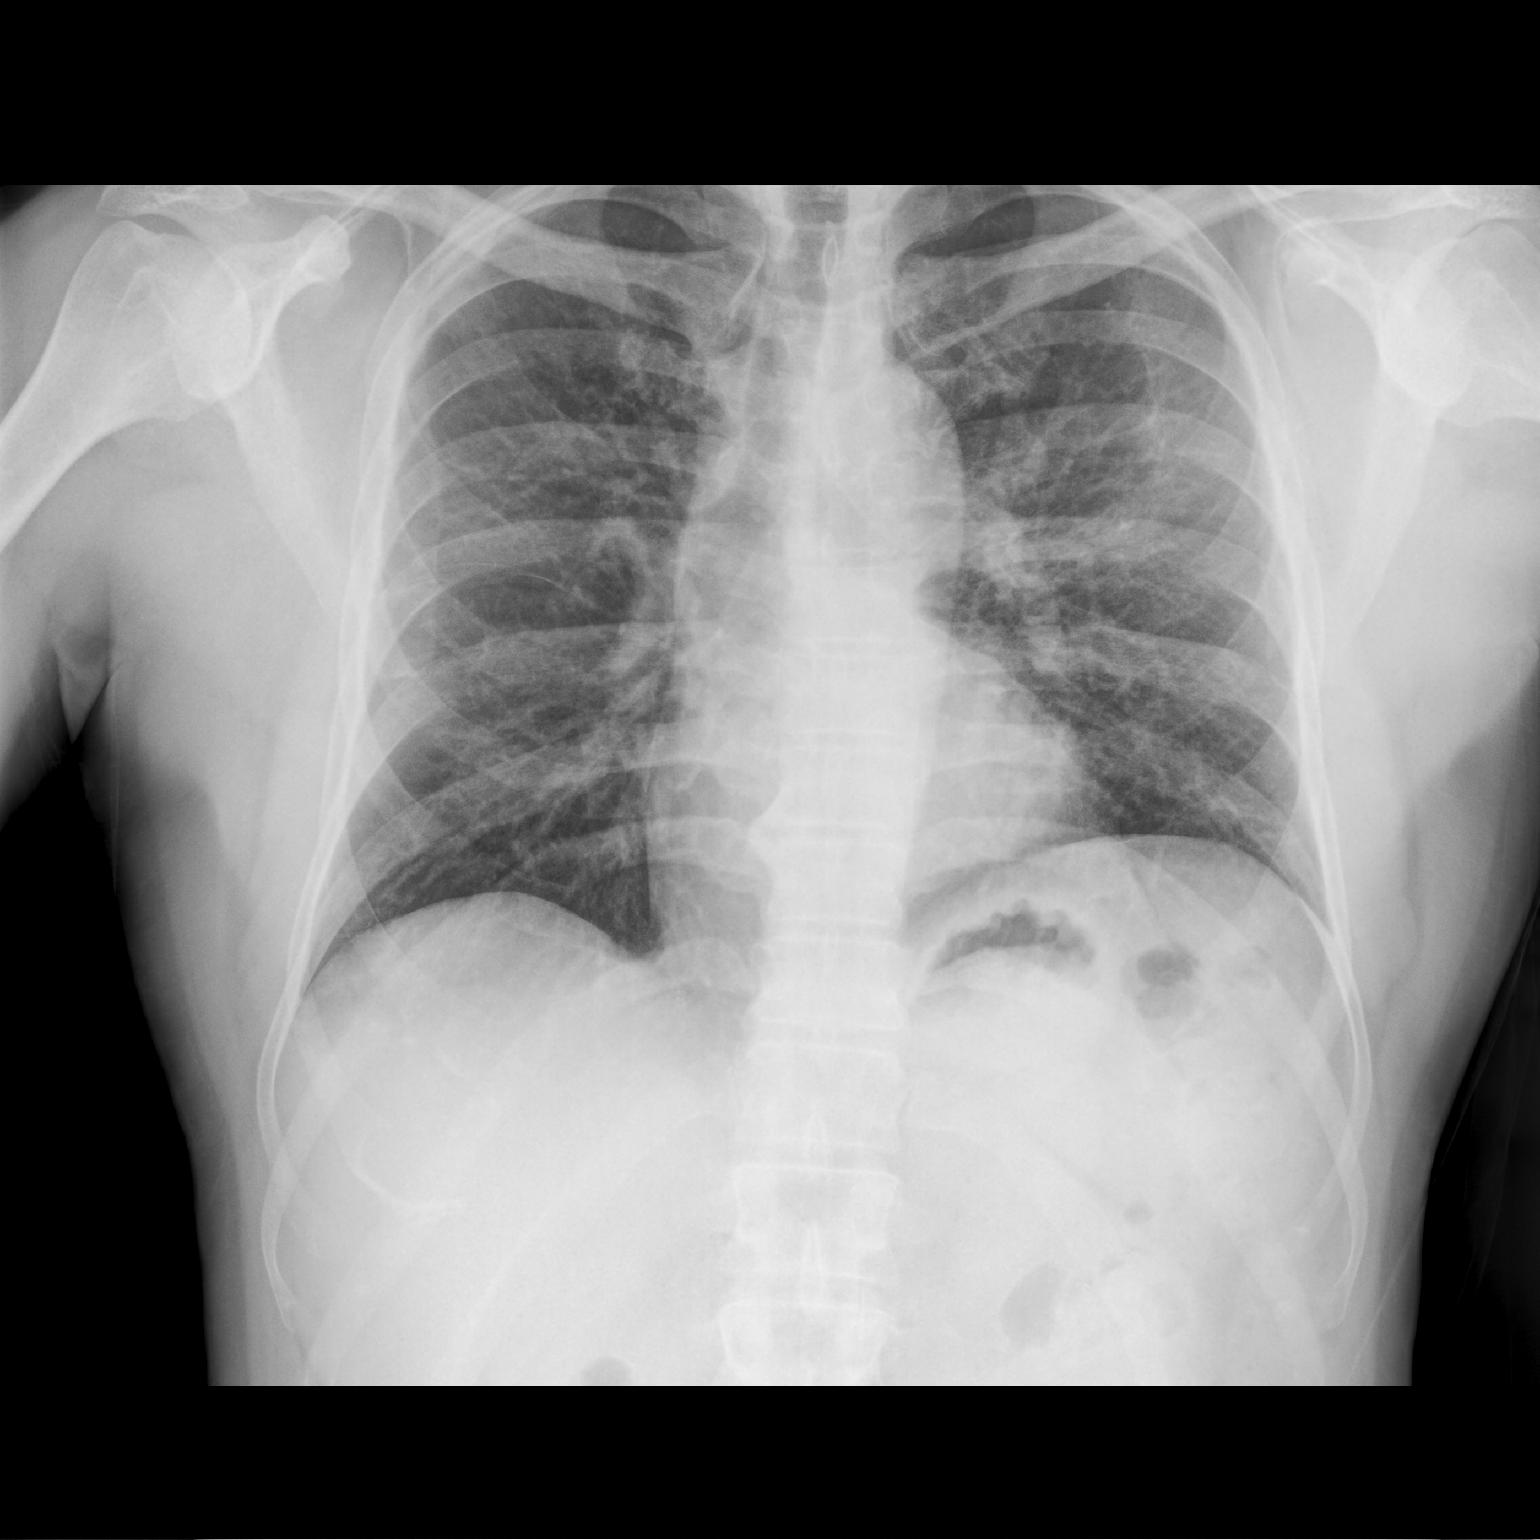

[chest lat]
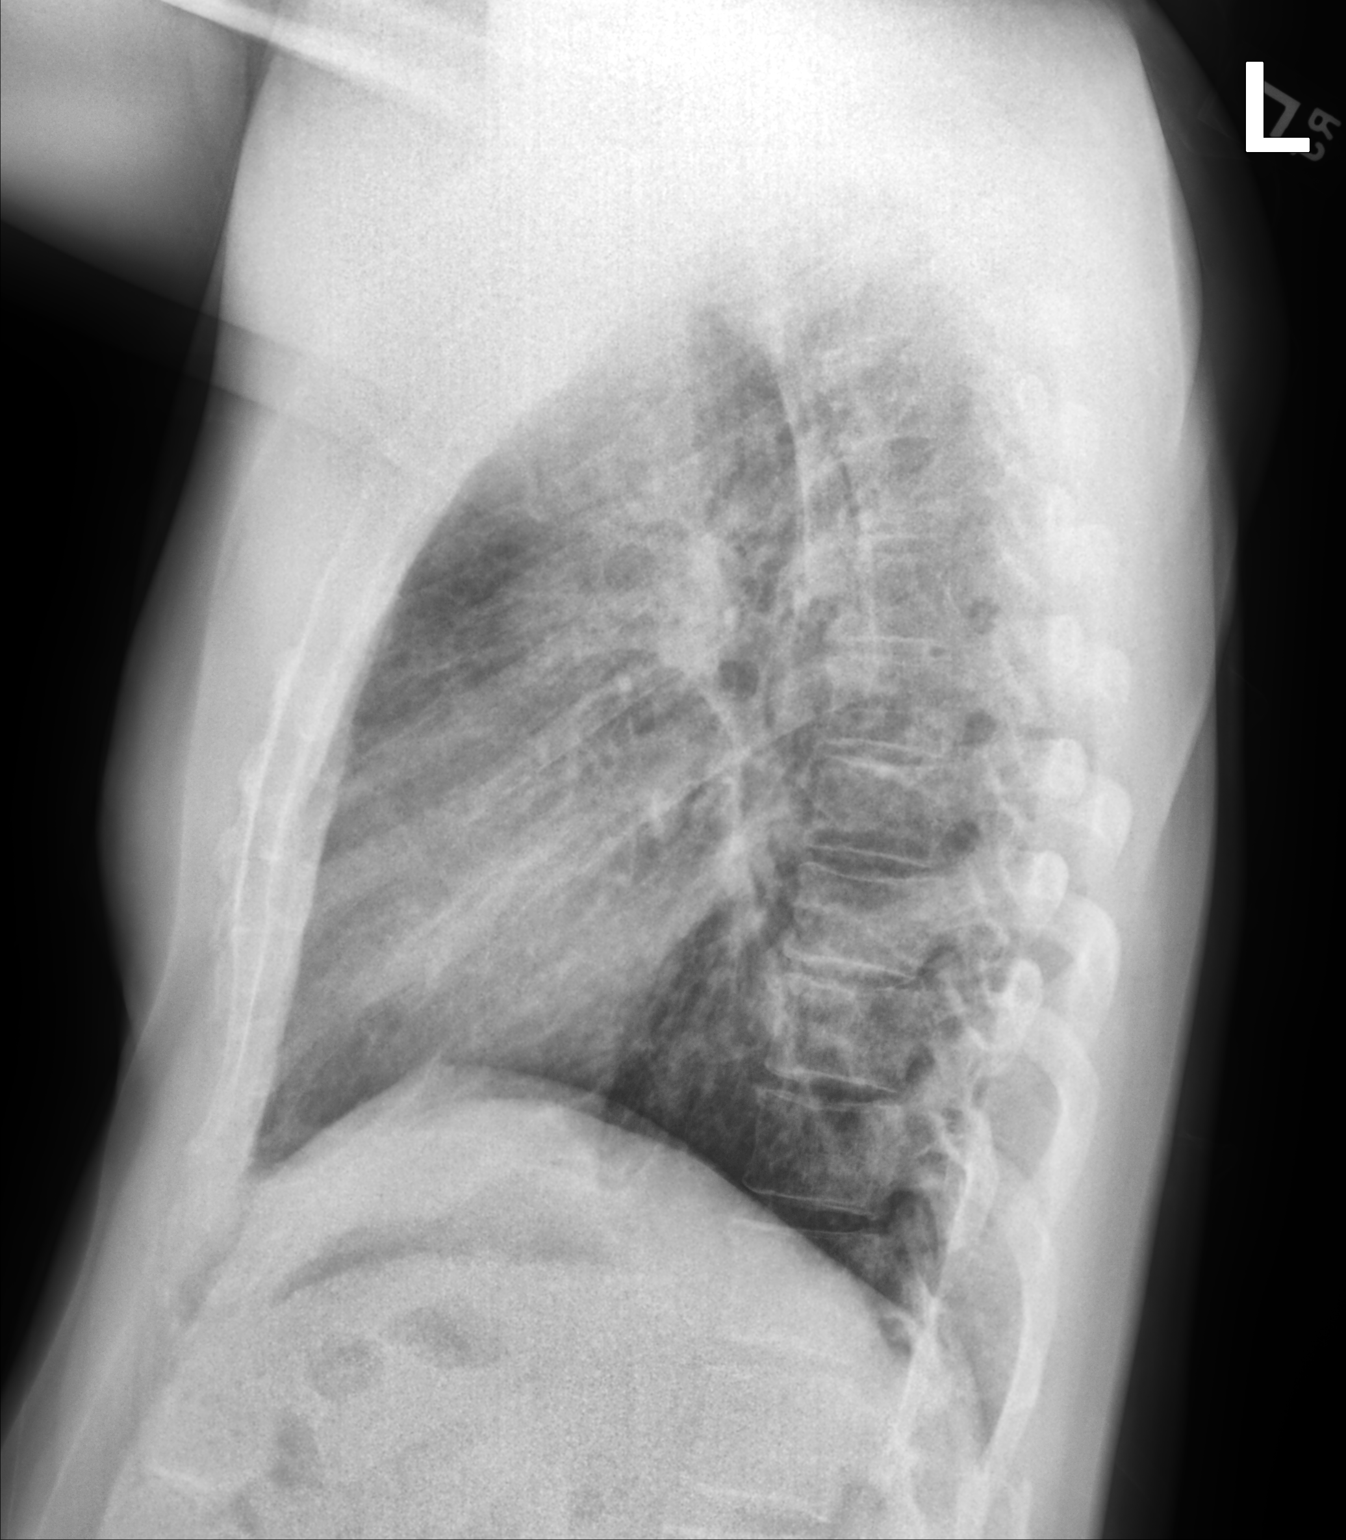

[2 of 2 positions shown; findings below may reference images not displayed]

FINDINGS: No significant change in the heterogeneous bilateral lung opacities
since prior exam. Slight elevation of left hemidiaphragm is new. No
new airspace disease. The heart is normal in size. Normal
mediastinal contours. No pleural fluid or pneumothorax. No acute
osseous abnormalities are seen.
IMPRESSION: No significant change in bilateral heterogeneous lung opacities
since prior exam consistent with [id number] sequela. New slight
elevation of left hemidiaphragm.

## 2021-11-21 ENCOUNTER — Other Ambulatory Visit (HOSPITAL_COMMUNITY): Payer: Self-pay

## 2021-12-04 ENCOUNTER — Other Ambulatory Visit (HOSPITAL_COMMUNITY): Payer: Self-pay

## 2021-12-04 DIAGNOSIS — M545 Low back pain, unspecified: Secondary | ICD-10-CM | POA: Diagnosis not present

## 2021-12-04 MED ORDER — ORPHENADRINE CITRATE ER 100 MG PO TB12
ORAL_TABLET | ORAL | 0 refills | Status: DC
  Filled 2021-12-04: qty 20, 10d supply, fill #0

## 2021-12-04 MED ORDER — MELOXICAM 15 MG PO TABS
ORAL_TABLET | ORAL | 0 refills | Status: DC
  Filled 2021-12-04: qty 14, 14d supply, fill #0

## 2021-12-05 ENCOUNTER — Other Ambulatory Visit (HOSPITAL_COMMUNITY): Payer: Self-pay

## 2021-12-05 MED ORDER — CYCLOBENZAPRINE HCL 5 MG PO TABS
ORAL_TABLET | ORAL | 0 refills | Status: DC
  Filled 2021-12-05: qty 30, 10d supply, fill #0

## 2021-12-05 MED ORDER — METHYLPREDNISOLONE 4 MG PO TBPK
ORAL_TABLET | ORAL | 0 refills | Status: DC
  Filled 2021-12-05: qty 21, 6d supply, fill #0

## 2021-12-08 ENCOUNTER — Other Ambulatory Visit (HOSPITAL_COMMUNITY): Payer: Self-pay

## 2021-12-08 DIAGNOSIS — S39012A Strain of muscle, fascia and tendon of lower back, initial encounter: Secondary | ICD-10-CM | POA: Diagnosis not present

## 2021-12-08 MED ORDER — GABAPENTIN 300 MG PO CAPS
ORAL_CAPSULE | ORAL | 1 refills | Status: DC
  Filled 2021-12-08: qty 30, 15d supply, fill #0

## 2021-12-12 DIAGNOSIS — M5416 Radiculopathy, lumbar region: Secondary | ICD-10-CM | POA: Diagnosis not present

## 2021-12-12 DIAGNOSIS — M6281 Muscle weakness (generalized): Secondary | ICD-10-CM | POA: Diagnosis not present

## 2021-12-17 ENCOUNTER — Other Ambulatory Visit (HOSPITAL_COMMUNITY): Payer: Self-pay

## 2021-12-25 ENCOUNTER — Other Ambulatory Visit (HOSPITAL_COMMUNITY): Payer: Self-pay

## 2021-12-26 ENCOUNTER — Other Ambulatory Visit (HOSPITAL_COMMUNITY): Payer: Self-pay

## 2021-12-26 MED ORDER — AMILORIDE HCL 5 MG PO TABS
5.0000 mg | ORAL_TABLET | Freq: Every day | ORAL | 6 refills | Status: DC
  Filled 2021-12-26: qty 30, 30d supply, fill #0
  Filled 2022-01-27: qty 30, 30d supply, fill #1
  Filled 2022-02-25: qty 30, 30d supply, fill #2
  Filled 2022-03-26: qty 30, 30d supply, fill #3
  Filled 2022-04-30: qty 30, 30d supply, fill #4
  Filled 2022-06-03: qty 30, 30d supply, fill #5

## 2022-01-08 ENCOUNTER — Other Ambulatory Visit (HOSPITAL_COMMUNITY): Payer: Self-pay

## 2022-01-08 DIAGNOSIS — E876 Hypokalemia: Secondary | ICD-10-CM | POA: Diagnosis not present

## 2022-01-09 ENCOUNTER — Other Ambulatory Visit (HOSPITAL_COMMUNITY): Payer: Self-pay

## 2022-01-10 ENCOUNTER — Other Ambulatory Visit (HOSPITAL_COMMUNITY): Payer: Self-pay

## 2022-01-27 ENCOUNTER — Other Ambulatory Visit (HOSPITAL_COMMUNITY): Payer: Self-pay

## 2022-01-28 ENCOUNTER — Other Ambulatory Visit (HOSPITAL_COMMUNITY): Payer: Self-pay

## 2022-01-29 ENCOUNTER — Other Ambulatory Visit (HOSPITAL_COMMUNITY): Payer: Self-pay

## 2022-01-29 DIAGNOSIS — E876 Hypokalemia: Secondary | ICD-10-CM | POA: Diagnosis not present

## 2022-01-29 DIAGNOSIS — Z8616 Personal history of COVID-19: Secondary | ICD-10-CM | POA: Diagnosis not present

## 2022-01-29 DIAGNOSIS — R7989 Other specified abnormal findings of blood chemistry: Secondary | ICD-10-CM | POA: Diagnosis not present

## 2022-01-29 MED ORDER — POTASSIUM CHLORIDE CRYS ER 20 MEQ PO TBCR
EXTENDED_RELEASE_TABLET | ORAL | 3 refills | Status: DC
  Filled 2022-01-29: qty 810, 90d supply, fill #0
  Filled 2022-05-26: qty 810, 90d supply, fill #1

## 2022-01-29 MED ORDER — AMILORIDE HCL 5 MG PO TABS
5.0000 mg | ORAL_TABLET | Freq: Every day | ORAL | 3 refills | Status: DC
  Filled 2022-01-29 – 2022-07-09 (×2): qty 90, 90d supply, fill #0
  Filled 2022-10-18: qty 90, 90d supply, fill #1
  Filled 2023-01-21 – 2023-01-29 (×2): qty 90, 90d supply, fill #2

## 2022-01-30 ENCOUNTER — Other Ambulatory Visit (HOSPITAL_COMMUNITY): Payer: Self-pay

## 2022-02-03 ENCOUNTER — Other Ambulatory Visit (HOSPITAL_COMMUNITY): Payer: Self-pay

## 2022-02-04 ENCOUNTER — Other Ambulatory Visit (HOSPITAL_COMMUNITY): Payer: Self-pay

## 2022-02-26 ENCOUNTER — Other Ambulatory Visit (HOSPITAL_COMMUNITY): Payer: Self-pay

## 2022-03-12 ENCOUNTER — Other Ambulatory Visit (HOSPITAL_COMMUNITY): Payer: Self-pay

## 2022-03-26 ENCOUNTER — Other Ambulatory Visit (HOSPITAL_COMMUNITY): Payer: Self-pay

## 2022-04-30 ENCOUNTER — Other Ambulatory Visit (HOSPITAL_COMMUNITY): Payer: Self-pay

## 2022-05-25 ENCOUNTER — Encounter: Payer: Self-pay | Admitting: *Deleted

## 2022-05-26 ENCOUNTER — Other Ambulatory Visit (HOSPITAL_COMMUNITY): Payer: Self-pay

## 2022-05-26 DIAGNOSIS — H60542 Acute eczematoid otitis externa, left ear: Secondary | ICD-10-CM | POA: Diagnosis not present

## 2022-05-26 DIAGNOSIS — H6122 Impacted cerumen, left ear: Secondary | ICD-10-CM | POA: Diagnosis not present

## 2022-05-26 MED ORDER — ALCLOMETASONE DIPROPIONATE 0.05 % EX CREA
TOPICAL_CREAM | CUTANEOUS | 3 refills | Status: DC
  Filled 2022-05-26: qty 15, 30d supply, fill #0

## 2022-05-28 ENCOUNTER — Other Ambulatory Visit (HOSPITAL_COMMUNITY): Payer: Self-pay

## 2022-05-29 ENCOUNTER — Other Ambulatory Visit (HOSPITAL_COMMUNITY): Payer: Self-pay

## 2022-06-04 ENCOUNTER — Other Ambulatory Visit (HOSPITAL_COMMUNITY): Payer: Self-pay

## 2022-06-11 ENCOUNTER — Other Ambulatory Visit (HOSPITAL_COMMUNITY): Payer: Self-pay

## 2022-06-12 ENCOUNTER — Other Ambulatory Visit (HOSPITAL_COMMUNITY): Payer: Self-pay

## 2022-06-26 ENCOUNTER — Encounter: Payer: Self-pay | Admitting: Family Medicine

## 2022-06-26 ENCOUNTER — Other Ambulatory Visit (HOSPITAL_COMMUNITY): Payer: Self-pay

## 2022-06-26 ENCOUNTER — Ambulatory Visit (INDEPENDENT_AMBULATORY_CARE_PROVIDER_SITE_OTHER): Payer: 59 | Admitting: Family Medicine

## 2022-06-26 VITALS — BP 120/70 | HR 83 | Temp 98.0°F | Ht 70.0 in | Wt 176.0 lb

## 2022-06-26 DIAGNOSIS — Z1212 Encounter for screening for malignant neoplasm of rectum: Secondary | ICD-10-CM | POA: Diagnosis not present

## 2022-06-26 DIAGNOSIS — E2681 Bartter's syndrome: Secondary | ICD-10-CM

## 2022-06-26 DIAGNOSIS — Z0000 Encounter for general adult medical examination without abnormal findings: Secondary | ICD-10-CM | POA: Diagnosis not present

## 2022-06-26 DIAGNOSIS — R7989 Other specified abnormal findings of blood chemistry: Secondary | ICD-10-CM | POA: Diagnosis not present

## 2022-06-26 DIAGNOSIS — Z1211 Encounter for screening for malignant neoplasm of colon: Secondary | ICD-10-CM

## 2022-06-26 DIAGNOSIS — Z8659 Personal history of other mental and behavioral disorders: Secondary | ICD-10-CM | POA: Diagnosis not present

## 2022-06-26 DIAGNOSIS — B351 Tinea unguium: Secondary | ICD-10-CM

## 2022-06-26 DIAGNOSIS — L723 Sebaceous cyst: Secondary | ICD-10-CM | POA: Diagnosis not present

## 2022-06-26 LAB — COMPREHENSIVE METABOLIC PANEL
ALT: 33 U/L (ref 0–53)
AST: 25 U/L (ref 0–37)
Albumin: 4.8 g/dL (ref 3.5–5.2)
Alkaline Phosphatase: 50 U/L (ref 39–117)
BUN: 18 mg/dL (ref 6–23)
CO2: 32 mEq/L (ref 19–32)
Calcium: 9.2 mg/dL (ref 8.4–10.5)
Chloride: 100 mEq/L (ref 96–112)
Creatinine, Ser: 1.26 mg/dL (ref 0.40–1.50)
GFR: 68.68 mL/min (ref >60.00)
Glucose, Bld: 87 mg/dL (ref 70–99)
Potassium: 3.8 mEq/L (ref 3.5–5.1)
Sodium: 141 mEq/L (ref 135–145)
Total Bilirubin: 0.8 mg/dL (ref 0.2–1.2)
Total Protein: 7.5 g/dL (ref 6.0–8.3)

## 2022-06-26 LAB — LIPID PANEL
Cholesterol: 214 mg/dL — ABNORMAL HIGH (ref 0–200)
HDL: 66.1 mg/dL (ref >39.00)
LDL Cholesterol: 121 mg/dL — ABNORMAL HIGH (ref 0–99)
NonHDL: 148.18
Total CHOL/HDL Ratio: 3
Triglycerides: 137 mg/dL (ref 0.0–149.0)
VLDL: 27.4 mg/dL (ref 0.0–40.0)

## 2022-06-26 LAB — CBC WITH DIFFERENTIAL/PLATELET
Basophils Absolute: 0.1 10*3/uL (ref 0.0–0.1)
Basophils Relative: 1.2 % (ref 0.0–3.0)
Eosinophils Absolute: 0.1 10*3/uL (ref 0.0–0.7)
Eosinophils Relative: 1.3 % (ref 0.0–5.0)
HCT: 41.1 % (ref 39.0–52.0)
Hemoglobin: 13.6 g/dL (ref 13.0–17.0)
Lymphocytes Relative: 27.1 % (ref 12.0–46.0)
Lymphs Abs: 2.1 10*3/uL (ref 0.7–4.0)
MCHC: 33.1 g/dL (ref 30.0–36.0)
MCV: 78.5 fl (ref 78.0–100.0)
Monocytes Absolute: 0.8 10*3/uL (ref 0.1–1.0)
Monocytes Relative: 10 % (ref 3.0–12.0)
Neutro Abs: 4.6 10*3/uL (ref 1.4–7.7)
Neutrophils Relative %: 60.4 % (ref 43.0–77.0)
Platelets: 247 10*3/uL (ref 150.0–400.0)
RBC: 5.23 Mil/uL (ref 4.22–5.81)
RDW: 14.4 % (ref 11.5–15.5)
WBC: 7.7 10*3/uL (ref 4.0–10.5)

## 2022-06-26 MED ORDER — ALPRAZOLAM 1 MG PO TABS
1.0000 mg | ORAL_TABLET | Freq: Every day | ORAL | 0 refills | Status: DC | PRN
  Filled 2022-06-26: qty 10, 10d supply, fill #0

## 2022-06-26 NOTE — Progress Notes (Signed)
Subjective  Chief Complaint  Patient presents with   Annual Exam    Pt here for Annual exam and is    HPI: David Martin is a 46 y.o. male who presents to [facility] at [facility] today for a Male Wellness Visit.   Wellness Visit: annual visit with health maintenance review and exam   HM: eligible for colon cancer screen. Avg risk. Imms current. Life is good overall. Healthy lifestyle. No new significant concerns: Black toenail Spot on back he wants checked  Body mass index is 25.25 kg/m. 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   IBS (irritable bowel syndrome) 04/23/2008   Health Maintenance  Topic Date Due   COLONOSCOPY (Pts 45-100yr Insurance coverage will need to be confirmed)  Never done   COVID-19 Vaccine (3 - [facility]) 07/12/2022 (Originally 01/24/2021)   TETANUS/TDAP  10/29/2025   INFLUENZA VACCINE  Completed   Hepatitis C Screening  Completed   HIV Screening  Completed   HPV VACCINES  Aged Out   Immunization History  Administered Date(s) Administered   Influenza Inj Mdck Quad Pf 06/28/2019   Influenza Split 06/03/2012   Influenza Whole 06/17/2010   Influenza,inj,Quad PF,6+ Mos 05/29/2016, 06/15/2022   Influenza-Unspecified 06/22/2021   PFIZER(Purple Top)SARS-COV-2 Vaccination 10/01/2020, 11/29/2020   Tdap 10/30/2015   We updated and reviewed the patient's past history in detail and it is documented below. Allergies: Patient has No Known Allergies. Past Medical History  has a past medical history of Anxiety, Cholelithiases (12/21/2017), Hypokalemia, and IBS (irritable  bowel syndrome). Past Surgical History  has no past surgical history on file. Social History Patient  reports that he has never smoked. He has never used smokeless tobacco. He reports that he does not drink alcohol and does not use drugs. Family History Patient family history includes Dementia in his maternal aunt, maternal uncle, and mother; Diabetes in his paternal grandfather and paternal grandmother; Hyperlipidemia in his paternal grandfather and paternal grandmother; Hypertension in his father. Review of Systems: Constitutional: negative for fever or malaise Ophthalmic: negative for photophobia, double vision or loss of vision Cardiovascular: negative for chest pain, dyspnea on exertion, or new LE swelling Respiratory: negative for SOB or persistent cough Gastrointestinal: negative for abdominal pain, change in bowel habits or melena Genitourinary: negative for dysuria or gross hematuria Musculoskeletal: negative for new gait disturbance or muscular weakness Integumentary: negative for new or persistent rashes, no breast lumps Neurological: negative for TIA or stroke symptoms Psychiatric: negative for SI or delusions Allergic/Immunologic: negative for hives  Patient Care Team    Relationship Specialty Notifications Start End  ALeamon Arnt MD PCP - General Family Medicine  12/21/17    Objective  Vitals: BP 120/70   Pulse 83   Temp 98 F (36.7 C)   Ht '5\' 10"'$  (1.778 m)   Wt 176 lb (79.8 kg)   SpO2 98%   BMI 25.25 kg/m  General:  Well developed, well nourished, no acute distress  Psych:  Alert and orientedx3,normal mood and affect HEENT:  Normocephalic, atraumatic, non-icteric sclera, PERRL, oropharynx is  clear without mass or exudate, supple neck without adenopathy, mass or thyromegaly Cardiovascular:  Normal S1, S2, RRR without gallop, rub or murmur, nondisplaced PMI, +2 distal pulses in bilateral upper and lower extremities. Respiratory:  Good breath sounds bilaterally,  CTAB with normal respiratory effort Gastrointestinal: normal bowel sounds, soft, non-tender, no noted masses. No HSM MSK: no deformities, contusions. Joints are without erythema or swelling. Spine and CVA region are nontender Skin:  Warm, no rashes or suspicious lesions noted Neurologic:    Mental status is normal. CN 2-11 are normal. Gross motor and sensory exams are normal. Stable gait. No tremor GU: No inguinal hernias or adenopathy are appreciated bilaterally  Assessment  1. Annual physical exam   2. 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 Low testosterone in male   4. History of panic attacks   5. Screening for colorectal cancer   6. Sebaceous cyst   7. Onychomycosis      Plan  Male Wellness Visit: Age appropriate Health Maintenance and Prevention measures were discussed with patient. Included topics are cancer screening recommendations, ways to keep healthy (see AVS) including dietary and exercise recommendations, regular eye and dental care, use of seat belts, and avoidance of moderate alcohol use and tobacco use. Refer to gi for CRC screen BMI: discussed patient's BMI and encouraged positive lifestyle modifications to help get to or maintain a target BMI. HM needs and immunizations were addressed and ordered. See below for orders. See HM and immunization section for updates. Routine labs and screening tests ordered including cmp, cbc and lipids where appropriate. Discussed recommendations regarding Vit D and calcium supplementation (see AVS) Discussed fungal toenails and sebaceous cyst  Follow up: 12 mo for cpe  Commons side effects, risks, benefits, and alternatives for medications and treatment plan prescribed today were discussed, and the patient expressed understanding of the given instructions. Patient is instructed to call or message via MyChart if he/she has any questions or concerns regarding our treatment plan. No barriers to understanding were identified. We discussed Red Flag  symptoms and signs in detail. Patient expressed understanding regarding what to do in case of urgent or emergency type symptoms.  Medication list was reconciled, printed and provided to the patient in AVS. Patient instructions and summary information was reviewed with the patient as documented in the AVS. This note was prepared with assistance of Dragon voice recognition software. Occasional wrong-word or sound-a-like substitutions may have occurred due to the inherent limitations of voice recognition software   Orders Placed This Encounter  Procedures   CBC with Differential/Platelet   Comprehensive metabolic panel   Lipid panel   Ambulatory referral to Gastroenterology   Meds ordered this encounter  Medications   ALPRAZolam (XANAX) 1 MG tablet    Sig: Take 1 tablet by mouth daily as needed for anxiety.    Dispense:  10 tablet    Refill:  0

## 2022-06-26 NOTE — Patient Instructions (Signed)
Please return in 12 months for your annual complete physical; please come fasting.   I will release your lab results to you on your MyChart account with further instructions. You may see the results before I do, but when I review them I will send you a message with my report or have my assistant call you if things need to be discussed. Please reply to my message with any questions. Thank you!   We will call you to get you set up with GI for colonoscopy.  If you have any questions or concerns, please don't hesitate to send me a message via MyChart or call the office at [phone]. Thank you for visiting with [place] today! It's our pleasure caring for you.   Epidermoid Cyst  An epidermoid cyst, also known as epidermal cyst, is a sac made of skin tissue. The sac contains a substance called keratin. Keratin is a protein that is normally secreted through the hair follicles. When keratin becomes trapped in the top layer of skin (epidermis), it can form an epidermoid cyst. Epidermoid cysts can be found anywhere on your body. These cysts are usually harmless (benign), and they may not cause symptoms unless they become inflamed or infected. What are the causes? This condition may be caused by: A blocked hair follicle. A hair that curls and re-enters the skin instead of growing straight out of the skin (ingrown hair). A blocked pore. Irritated skin. An injury to the skin. Certain conditions that are passed along from parent to child (inherited). Human papillomavirus (HPV). This happens rarely when cysts occur on the bottom of the feet. Long-term (chronic) sun damage to the skin. What increases the risk? The following factors may make you more likely to develop an epidermoid cyst: Having acne. Being male. Having an injury to the skin. Being past puberty. Having certain rare genetic disorders. What are the signs or symptoms? The only symptom of this condition may be a small, painless lump underneath  the skin. When an epidermal cyst ruptures, it may become inflamed. True infection in cysts is rare. Symptoms may include: Redness. Inflammation. Tenderness. Warmth. Keratin draining from the cyst. Keratin is grayish-white, bad-smelling substance. Pus draining from the cyst. How is this diagnosed? This condition is diagnosed with a physical exam. In some cases, you may have a sample of tissue (biopsy) taken from your cyst to be examined under a microscope or tested for bacteria. You may be referred to a health care provider who specializes in skin care (dermatologist). How is this treated? If a cyst becomes inflamed, treatment may include: Opening and draining the cyst, done by a health care provider. After draining, minor surgery to remove the rest of the cyst may be done. Taking antibiotic medicine. Having injections of medicines (steroids) that help to reduce inflammation. Having surgery to remove the cyst. Surgery may be done if the cyst: Becomes large. Bothers you. Has a chance of turning into cancer. Do not try to open a cyst yourself. Follow these instructions at home: Medicines If you were prescribed an antibiotic medicine, take it it as told by your health care provider. Do not stop using the antibiotic even if you start to feel better. Take over-the-counter and prescription medicines only as told by your health care provider. General instructions Keep the area around your cyst clean and dry. Wear loose, dry clothing. Avoid touching your cyst. Check your cyst every day for signs of infection. Check for: Redness, swelling, or pain. Fluid or blood. Warmth. Pus  or a bad smell. Keep all follow-up visits. This is important. How is this prevented? Wear clean, dry, clothing. Avoid wearing tight clothing. Keep your skin clean and dry. Take showers or baths every day. Contact a health care provider if: Your cyst develops symptoms of infection. Your condition is not improving  or is getting worse. You develop a cyst that looks different from other cysts you have had. You have a fever. Get help right away if: Redness spreads from the cyst into the surrounding area. Summary An epidermoid cyst is a sac made of skin tissue. These cysts are usually harmless (benign), and they may not cause symptoms unless they become inflamed. If a cyst becomes inflamed, treatment may include surgery to open and drain the cyst, or to remove it. Treatment may also include medicines by mouth or through an injection. Take over-the-counter and prescription medicines only as told by your health care provider. If you were prescribed an antibiotic medicine, take it as told by your health care provider. Do not stop using the antibiotic even if you start to feel better. Contact a health care provider if your condition is not improving or is getting worse. Keep all follow-up visits as told by your health care provider. This is important. This information is not intended to replace advice given to you by your health care provider. Make sure you discuss any questions you have with your health care provider. Document Revised: 11/22/2019 Document Reviewed: 11/22/2019 Elsevier Patient Education  [place].

## 2022-07-09 ENCOUNTER — Other Ambulatory Visit (HOSPITAL_COMMUNITY): Payer: Self-pay

## 2022-08-06 ENCOUNTER — Encounter: Payer: Self-pay | Admitting: Gastroenterology

## 2022-08-14 ENCOUNTER — Other Ambulatory Visit (HOSPITAL_COMMUNITY): Payer: Self-pay

## 2022-09-07 ENCOUNTER — Ambulatory Visit (AMBULATORY_SURGERY_CENTER): Payer: Self-pay | Admitting: *Deleted

## 2022-09-07 ENCOUNTER — Other Ambulatory Visit (HOSPITAL_COMMUNITY): Payer: Self-pay

## 2022-09-07 VITALS — Ht 70.0 in | Wt 175.0 lb

## 2022-09-07 DIAGNOSIS — Z1211 Encounter for screening for malignant neoplasm of colon: Secondary | ICD-10-CM

## 2022-09-07 MED ORDER — NA SULFATE-K SULFATE-MG SULF 17.5-3.13-1.6 GM/177ML PO SOLN
1.0000 | Freq: Once | ORAL | 0 refills | Status: AC
Start: 2022-09-07 — End: 2022-09-10
  Filled 2022-09-07: qty 354, 1d supply, fill #0

## 2022-09-07 NOTE — Progress Notes (Signed)

## 2022-09-09 ENCOUNTER — Other Ambulatory Visit (HOSPITAL_COMMUNITY): Payer: Self-pay

## 2022-09-23 ENCOUNTER — Other Ambulatory Visit (HOSPITAL_COMMUNITY): Payer: Self-pay

## 2022-09-23 ENCOUNTER — Encounter: Payer: Self-pay | Admitting: Gastroenterology

## 2022-09-24 ENCOUNTER — Other Ambulatory Visit (HOSPITAL_COMMUNITY): Payer: Self-pay

## 2022-09-24 MED ORDER — POTASSIUM CHLORIDE CRYS ER 20 MEQ PO TBCR
60.0000 meq | EXTENDED_RELEASE_TABLET | Freq: Three times a day (TID) | ORAL | 3 refills | Status: DC
  Filled 2022-09-24: qty 810, 90d supply, fill #0
  Filled 2023-01-21 – 2023-01-29 (×2): qty 810, 90d supply, fill #1
  Filled 2023-04-28: qty 810, 90d supply, fill #2
  Filled 2023-08-05: qty 810, 90d supply, fill #3

## 2022-09-25 ENCOUNTER — Other Ambulatory Visit (HOSPITAL_COMMUNITY): Payer: Self-pay

## 2022-09-30 ENCOUNTER — Ambulatory Visit: Payer: Commercial Managed Care - PPO | Admitting: Gastroenterology

## 2022-09-30 ENCOUNTER — Encounter: Payer: Self-pay | Admitting: Gastroenterology

## 2022-09-30 VITALS — BP 113/74 | HR 72 | Temp 96.0°F | Resp 12 | Ht 70.0 in | Wt 175.0 lb

## 2022-09-30 DIAGNOSIS — Z1211 Encounter for screening for malignant neoplasm of colon: Secondary | ICD-10-CM

## 2022-09-30 DIAGNOSIS — D125 Benign neoplasm of sigmoid colon: Secondary | ICD-10-CM | POA: Diagnosis not present

## 2022-09-30 DIAGNOSIS — F419 Anxiety disorder, unspecified: Secondary | ICD-10-CM | POA: Diagnosis not present

## 2022-09-30 MED ORDER — SODIUM CHLORIDE 0.9 % IV SOLN: 500.0000 mL | Freq: Once | INTRAVENOUS | Status: DC

## 2022-09-30 NOTE — Progress Notes (Signed)
Report to pacu rn. Vss. Care resumed by rn. 

## 2022-09-30 NOTE — Progress Notes (Signed)
Called to room to assist during endoscopic procedure.  Patient ID and intended procedure confirmed with present staff. Received instructions for my participation in the procedure from the performing physician.  

## 2022-09-30 NOTE — Progress Notes (Signed)
Pt's states no medical or surgical changes since previsit or office visit. 

## 2022-09-30 NOTE — Progress Notes (Signed)
History & Physical  Primary Care Physician:  [name], MD Primary Gastroenterologist: Lucio Edward, MD  Impression / Plan:  CRC screening, average risk, for colonoscopy.  CHIEF COMPLAINT:  CRC screening    HPI: David Martin is a 47 y.o. male for CRC screening, average risk, with colonoscopy.   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 Owens Loffler, MD GI [phone]   Hyperlipidemia    Hypokalemia    IBS (irritable bowel syndrome)     Past Surgical History:  Procedure Laterality Date   NO PAST SURGERIES     UPDATED 09/07/22    Prior to Admission medications   Medication Sig Start Date End Date Taking? Authorizing Provider  alclomethasone (ACLOVATE) 0.05 % cream Apply sparingly to left ear canal opening twice daily for two weeks. 08/17/22   [provider]  ALPRAZolam Duanne Moron) 1 MG tablet Take 1 tablet by mouth daily as needed for anxiety. Patient not taking: Reported on 09/07/2022 06/26/22   [name], MD  aMILoride [facility]) 5 MG tablet Take 1 tablet by mouth daily 01/29/22     Magnesium Cl-Calcium Carbonate (SLOW-MAG PO) Take 4 tablets by mouth 3 (three) times daily.    [provider]  Omega-3 Fatty Acids (FISH OIL TRIPLE STRENGTH PO) Take 1 capsule by mouth 2 (two) times daily.     [provider]  Plant Sterol Stanol-Pantethine (CHOLEST OFF COMPLETE PO)     [provider]  potassium chloride SA (KLOR-CON M20) 20 MEQ tablet Take 3 tablets by mouth three times daily 03/12/21     potassium chloride SA (KLOR-CON M20) 20 MEQ tablet Take 3 tablets (60 mEq total) by mouth 3 (three) times daily. 09/24/22       Current Outpatient Medications  Medication Sig Dispense Refill   alclomethasone (ACLOVATE) 0.05 % cream Apply sparingly to left ear canal opening twice daily for two weeks.     ALPRAZolam (XANAX) 1 MG tablet Take 1 tablet by mouth daily as  needed for anxiety. (Patient not taking: Reported on 09/07/2022) 10 tablet 0   aMILoride (MIDAMOR) 5 MG tablet Take 1 tablet by mouth daily 90 tablet 3   Magnesium Cl-Calcium Carbonate (SLOW-MAG PO) Take 4 tablets by mouth 3 (three) times daily.     Omega-3 Fatty Acids (FISH OIL TRIPLE STRENGTH PO) Take 1 capsule by mouth 2 (two) times daily.      Plant Sterol Stanol-Pantethine (CHOLEST OFF COMPLETE PO)      potassium chloride SA (KLOR-CON M20) 20 MEQ tablet Take 3 tablets by mouth three times daily 270 tablet 11   potassium chloride SA (KLOR-CON M20) 20 MEQ tablet Take 3 tablets (60 mEq total) by mouth 3 (three) times daily. 810 tablet 3   Current Facility-Administered Medications  Medication Dose Route Frequency Provider Last Rate Last Admin   0.9 %  sodium chloride infusion  500 mL Intravenous Once Ladene Artist, MD        Allergies as of 09/30/2022   (No Known Allergies)    Family History  Problem Relation Age of Onset   Dementia Mother    Colon polyps Father    Hypertension Father    Dementia Maternal Aunt    Dementia Maternal Uncle    Diabetes Paternal Grandmother    Hyperlipidemia Paternal Grandmother    Diabetes Paternal Grandfather    Hyperlipidemia Paternal [employer] officer  Colon cancer Neg Hx    Crohn's disease Neg Hx    Esophageal cancer Neg Hx    Rectal cancer Neg Hx    Stomach cancer Neg Hx    Ulcerative colitis Neg Hx     Social History   Socioeconomic History   Marital status: Married    Spouse name: natalia   Number of children: 2   Years of education: Not on file   Highest education level: Not on file  Occupational History   Occupation: respiratory therapist    Employer: [employer]  Tobacco Use   Smoking status: Never   Smokeless tobacco: Never  Vaping Use   Vaping Use: Never used  Substance and Sexual Activity   Alcohol use: No   Drug use: No   Sexual activity: Yes  Other Topics Concern   Not on file  Social History Narrative   Not on file    Social Determinants of Health   Financial Resource Strain: Not on file  Food Insecurity: Not on file  Transportation Needs: Not on file  Physical Activity: Not on file  Stress: Not on file  Social Connections: Not on file  Intimate Partner Violence: Not on file    Review of Systems:  All systems reviewed were negative except where noted in HPI.   Physical Exam: General:  Alert, well-developed, in NAD Head:  Normocephalic and atraumatic. Eyes:  Sclera clear, no icterus.   Conjunctiva pink. Ears:  Normal auditory acuity. Mouth:  No deformity or lesions.  Neck:  Supple; no masses. Lungs:  Clear throughout to auscultation.   No wheezes, crackles, or rhonchi.  Heart:  Regular rate and rhythm; no murmurs. Abdomen:  Soft, nondistended, nontender. No masses, hepatomegaly. No palpable masses.  Normal bowel sounds.    Rectal:  Deferred   Msk:  Symmetrical without gross deformities. Extremities:  Without edema. Neurologic:  Alert and  oriented x 4; grossly normal neurologically. Skin:  Intact without significant lesions or rashes. Psych:  Alert and cooperative. Normal mood and affect.   [name]. Fuller Plan  09/30/2022, 10:05 AM See Shea Evans,  GI, to contact our on call provider

## 2022-09-30 NOTE — Op Note (Signed)
[name] Patient Name: David Martin Procedure Date: 09/30/2022 10:18 AM MRN: [id number] Endoscopist: Ladene Artist , MD, [id number] Age: 47 Referring MD:  Date of Birth: [birth year] Gender: Male Account #: [id number] Procedure:                Colonoscopy Indications:              Screening for colorectal malignant neoplasm Medicines:                Monitored Anesthesia Care Procedure:                Pre-Anesthesia Assessment:                           - Prior to the procedure, a History and Physical                            was performed, and patient medications and                            allergies were reviewed. The patient's tolerance of                            previous anesthesia was also reviewed. The risks                            and benefits of the procedure and the sedation                            options and risks were discussed with the patient.                            All questions were answered, and informed consent                            was obtained. Prior Anticoagulants: The patient has                            taken no anticoagulant or antiplatelet agents. ASA                            Grade Assessment: II - A patient with mild systemic                            disease. After reviewing the risks and benefits,                            the patient was deemed in satisfactory condition to                            undergo the procedure.                           After obtaining informed consent, the colonoscope  was passed under direct vision. Throughout the                            procedure, the patient's blood pressure, pulse, and                            oxygen saturations were monitored continuously. The                            CF [id number] #[id number] was introduced through the anus                            and advanced to the the cecum, identified by                            appendiceal  orifice and ileocecal valve. The                            ileocecal valve, appendiceal orifice, and rectum                            were photographed. The quality of the bowel                            preparation was good. The colonoscopy was performed                            without difficulty. The patient tolerated the                            procedure well. Scope In: 10:27:36 AM Scope Out: 10:39:35 AM Scope Withdrawal Time: 0 hours 10 minutes 31 seconds  Total Procedure Duration: 0 hours 11 minutes 59 seconds  Findings:                 The perianal and digital rectal examinations were                            normal.                           A 6 mm polyp was found in the sigmoid colon. The                            polyp was sessile. The polyp was removed with a                            cold snare. Resection and retrieval were complete.                           The exam was otherwise without abnormality on                            direct and retroflexion views. Complications:  No immediate complications. Estimated blood loss:                            None. Estimated Blood Loss:     Estimated blood loss: none. Impression:               - One 6 mm polyp in the sigmoid colon, removed with                            a cold snare. Resected and retrieved.                           - The examination was otherwise normal on direct                            and retroflexion views. Recommendation:           - Repeat colonoscopy after studies are complete for                            surveillance based on pathology results.                           - Patient has a contact number available for                            emergencies. The signs and symptoms of potential                            delayed complications were discussed with the                            patient. Return to normal activities tomorrow.                            Written discharge  instructions were provided to the                            patient.                           - Resume previous diet.                           - Continue present medications.                           - Await pathology results. Ladene Artist, MD 09/30/2022 10:42:21 AM This report has been signed electronically.

## 2022-09-30 NOTE — Patient Instructions (Signed)
Please read handouts provided. Continue present medications. Await pathology results.   YOU HAD AN ENDOSCOPIC PROCEDURE TODAY AT THE [facility]:   Refer to the procedure report that was given to you for any specific questions about what was found during the examination.  If the procedure report does not answer your questions, please call your gastroenterologist to clarify.  If you requested that your care partner not be given the details of your procedure findings, then the procedure report has been included in a sealed envelope for you to review at your convenience later.  YOU SHOULD EXPECT: Some feelings of bloating in the abdomen. Passage of more gas than usual.  Walking can help get rid of the air that was put into your GI tract during the procedure and reduce the bloating. If you had a lower endoscopy (such as a colonoscopy or flexible sigmoidoscopy) you may notice spotting of blood in your stool or on the toilet paper. If you underwent a bowel prep for your procedure, you may not have a normal bowel movement for a few days.  Please Note:  You might notice some irritation and congestion in your nose or some drainage.  This is from the oxygen used during your procedure.  There is no need for concern and it should clear up in a day or so.  SYMPTOMS TO REPORT IMMEDIATELY:  Following lower endoscopy (colonoscopy or flexible sigmoidoscopy):  Excessive amounts of blood in the stool  Significant tenderness or worsening of abdominal pains  Swelling of the abdomen that is new, acute  Fever of 100F or higher  For urgent or emergent issues, a gastroenterologist can be reached at any hour by calling [phone]. Do not use MyChart messaging for urgent concerns.    DIET:  We do recommend a small meal at first, but then you may proceed to your regular diet.  Drink plenty of fluids but you should avoid alcoholic beverages for 24 hours.  ACTIVITY:  You should plan to take it easy for  the rest of today and you should NOT DRIVE or use heavy machinery until tomorrow (because of the sedation medicines used during the test).    FOLLOW UP: Our staff will call the number listed on your records the next business day following your procedure.  We will call around 7:15- 8:00 am to check on you and address any questions or concerns that you may have regarding the information given to you following your procedure. If we do not reach you, we will leave a message.     If any biopsies were taken you will be contacted by phone or by letter within the next 1-3 weeks.  Please call us at [phone] if you have not heard about the biopsies in 3 weeks.    SIGNATURES/CONFIDENTIALITY: You and/or your care partner have signed paperwork which will be entered into your electronic medical record.  These signatures attest to the fact that that the information above on your After Visit Summary has been reviewed and is understood.  Full responsibility of the confidentiality of this discharge information lies with you and/or your care-partner. 

## 2022-10-01 ENCOUNTER — Telehealth: Payer: Self-pay | Admitting: *Deleted

## 2022-10-01 NOTE — Telephone Encounter (Signed)
  Follow up Call-     09/30/2022   10:09 AM  Call back number  Post procedure Call Back phone  # [phone]  Permission to leave phone message Yes     Patient questions:  Do you have a fever, pain , or abdominal swelling? No. Pain Score  0 *  Have you tolerated food without any problems? Yes.    Have you been able to return to your normal activities? Yes.    Do you have any questions about your discharge instructions: Diet   No. Medications  No. Follow up visit  No.  Do you have questions or concerns about your Care? No.  Actions: * If pain score is 4 or above: No action needed, pain <4.

## 2022-10-14 DIAGNOSIS — E876 Hypokalemia: Secondary | ICD-10-CM | POA: Diagnosis not present

## 2022-10-15 ENCOUNTER — Encounter: Payer: Self-pay | Admitting: Gastroenterology

## 2022-10-20 ENCOUNTER — Other Ambulatory Visit (HOSPITAL_COMMUNITY): Payer: Self-pay

## 2022-12-03 ENCOUNTER — Ambulatory Visit: Payer: Commercial Managed Care - PPO | Admitting: Family Medicine

## 2022-12-09 ENCOUNTER — Ambulatory Visit: Payer: Commercial Managed Care - PPO | Admitting: Family Medicine

## 2023-01-21 ENCOUNTER — Other Ambulatory Visit: Payer: Self-pay

## 2023-01-21 ENCOUNTER — Other Ambulatory Visit (HOSPITAL_COMMUNITY): Payer: Self-pay

## 2023-01-29 ENCOUNTER — Other Ambulatory Visit (HOSPITAL_COMMUNITY): Payer: Self-pay

## 2023-01-30 ENCOUNTER — Other Ambulatory Visit (HOSPITAL_COMMUNITY): Payer: Self-pay

## 2023-02-22 DIAGNOSIS — Z8616 Personal history of COVID-19: Secondary | ICD-10-CM | POA: Diagnosis not present

## 2023-02-22 DIAGNOSIS — M79676 Pain in unspecified toe(s): Secondary | ICD-10-CM | POA: Diagnosis not present

## 2023-02-22 DIAGNOSIS — E876 Hypokalemia: Secondary | ICD-10-CM | POA: Diagnosis not present

## 2023-02-22 DIAGNOSIS — R7989 Other specified abnormal findings of blood chemistry: Secondary | ICD-10-CM | POA: Diagnosis not present

## 2023-03-01 ENCOUNTER — Ambulatory Visit: Payer: Commercial Managed Care - PPO | Admitting: Podiatry

## 2023-03-01 ENCOUNTER — Other Ambulatory Visit (HOSPITAL_COMMUNITY): Payer: Self-pay

## 2023-03-01 ENCOUNTER — Ambulatory Visit (INDEPENDENT_AMBULATORY_CARE_PROVIDER_SITE_OTHER): Payer: Commercial Managed Care - PPO

## 2023-03-01 DIAGNOSIS — M79674 Pain in right toe(s): Secondary | ICD-10-CM | POA: Diagnosis not present

## 2023-03-01 DIAGNOSIS — M7989 Other specified soft tissue disorders: Secondary | ICD-10-CM

## 2023-03-01 DIAGNOSIS — B353 Tinea pedis: Secondary | ICD-10-CM | POA: Diagnosis not present

## 2023-03-01 DIAGNOSIS — B351 Tinea unguium: Secondary | ICD-10-CM

## 2023-03-01 MED ORDER — MELOXICAM 15 MG PO TABS
15.0000 mg | ORAL_TABLET | Freq: Every day | ORAL | 0 refills | Status: DC
Start: 2023-03-01 — End: 2023-03-01

## 2023-03-01 MED ORDER — TERBINAFINE HCL 250 MG PO TABS: 250.0000 mg | ORAL_TABLET | Freq: Every day | ORAL | 0 refills | Status: DC

## 2023-03-01 MED ORDER — METHYLPREDNISOLONE 4 MG PO TBPK
ORAL_TABLET | ORAL | 0 refills | Status: DC
  Filled 2023-03-01: qty 21, 6d supply, fill #0

## 2023-03-01 MED ORDER — METHYLPREDNISOLONE 4 MG PO TBPK
ORAL_TABLET | ORAL | 0 refills | Status: DC
Start: 2023-03-01 — End: 2023-03-01

## 2023-03-01 MED ORDER — TERBINAFINE HCL 250 MG PO TABS
250.0000 mg | ORAL_TABLET | Freq: Every day | ORAL | 0 refills | Status: AC
  Filled 2023-03-01: qty 30, 30d supply, fill #0

## 2023-03-01 MED ORDER — KETOCONAZOLE 2 % EX CREA
1.0000 | TOPICAL_CREAM | Freq: Every day | CUTANEOUS | 0 refills | Status: AC
  Filled 2023-03-01: qty 30, 30d supply, fill #0

## 2023-03-01 MED ORDER — KETOCONAZOLE 2 % EX CREA: 1.0000 | TOPICAL_CREAM | Freq: Every day | CUTANEOUS | 0 refills | Status: DC

## 2023-03-01 MED ORDER — MELOXICAM 15 MG PO TABS
15.0000 mg | ORAL_TABLET | Freq: Every day | ORAL | 0 refills | Status: DC
  Filled 2023-03-01: qty 30, 30d supply, fill #0

## 2023-03-01 NOTE — Progress Notes (Signed)
Subjective:  Patient ID: David Martin, male    DOB: Jan 17, 1976,  MRN: [id number]  Chief Complaint  Patient presents with   Toe Pain    Swelling and toe pain to the right hallux x 13 days. Patient stated he injured his toe in 1996 and his toe has been hurting off and on.    Nail Problem    Nail fungus to left 5th toe. Patient has never been treated for nail fungus.    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     47 y.o. male presents with concern for pain in the right great toe for about 13 days.  He says that he had an injury to this toe a long time ago in the past thinks he may have fractured it but it took a long time to heal.  He is always had issues with that area in the great toe ever since but has never had it looked at or treated.  Generally the pain goes away on its own.  He is concern about possible gout.  Also he is concerned about athlete's foot fungal infection both in between the toes and bottom of the foot with some red rash and dry flaking skin as well as nail fungus to the left fifth toe.  He denies being treated for nail fungus ever.  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 Rob Bunting, MD GI 2015   Hyperlipidemia    Hypokalemia    IBS (irritable bowel syndrome)     No Known Allergies  ROS: Negative except as per HPI above  Objective:  General: AAO x3, NAD  Dermatological: Interdigital red spotty rash and xerosis of the skin is noted and bilateral foot.  Also with left fifth toe thickening black to yellow discoloration and dystrophy.  Vascular:  Dorsalis Pedis artery and Posterior Tibial artery pedal pulses are 2/4 bilateral.  Capillary fill time < 3 sec to all digits.   Neruologic: Grossly intact via light touch bilateral. Protective threshold intact to all sites bilateral.   Musculoskeletal: Significant mallet toe deformity at the right hallux interphalangeal  joint.  There is noted to be flexion contracture with very limited to no range of motion noted at the right hallux IPJ.  There is pain on palpation of the dorsal and medial aspect of the joint.  Significant osseous prominence is noted.  Gait: Unassisted, Nonantalgic.   No images are attached to the encounter.  Radiographs:  Date: 03/01/2023 XR the right foot Weightbearing AP/Lateral/Oblique   Findings: Attention directed to the right hallux there is no to be severe arthritic changes of the interphalangeal joint with osseous fracture fragment present in the medial aspect of the joint.  There is irregularity at the joint space with loss of joint space and osteophyte formation as well as subchondral sclerosis.  Evidence of prior intra-articular fracture that has healed with osseous fragmentation present. Assessment:   1. Great toe pain, right   2. Tinea pedis of both feet   3. Onychomycosis   4. Pain and swelling of toe of right foot      Plan:  Patient was evaluated and treated and all questions answered.  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 -I recommend anti-inflammatory medications.  Discussed possible steroid  injection however patient wants to defer at this time -Recommend methylprednisolone 4 mg steroid taper pack take as directed for 6 days -eRx for meloxicam 15 mg take once daily for the next 3 days -If not successful we will have to consider steroid injection versus surgical intervention which would include arthrodesis of the interphalangeal joint of the right great toe.  Discussed this as well as the complications and postoperative course with the patient.  # Onychomycosis of the left fifth toenail Onychomycosis -Educated on etiology of nail fungus. -eRx for oral terbinafine 250 mg #30. Educated on risks and benefits of the  medication.  # Tinea pedis Discussed the etiology and treatment options for tinea pedis.  Discussed topical and oral treatment.  Recommended topical treatment with 2% ketoconazole cream.  This was sent to the patient's pharmacy.  Also discussed appropriate foot hygiene, use of antifungal spray such as Tinactin in shoes, as well as cleaning her foot surfaces such as showers and bathroom floors with bleach.   Return in about 3 months (around 06/01/2023) for f/u fungal infection.          Corinna Gab, DPM [facility] / [facility]

## 2023-04-05 ENCOUNTER — Ambulatory Visit: Payer: Commercial Managed Care - PPO | Admitting: Podiatry

## 2023-04-06 ENCOUNTER — Ambulatory Visit: Payer: Commercial Managed Care - PPO | Admitting: Podiatry

## 2023-04-07 ENCOUNTER — Ambulatory Visit: Payer: Commercial Managed Care - PPO | Admitting: Podiatry

## 2023-04-12 ENCOUNTER — Other Ambulatory Visit (HOSPITAL_COMMUNITY): Payer: Self-pay

## 2023-04-12 ENCOUNTER — Ambulatory Visit: Payer: Commercial Managed Care - PPO | Admitting: Podiatry

## 2023-04-12 DIAGNOSIS — B351 Tinea unguium: Secondary | ICD-10-CM

## 2023-04-12 DIAGNOSIS — M7989 Other specified soft tissue disorders: Secondary | ICD-10-CM

## 2023-04-12 DIAGNOSIS — M79674 Pain in right toe(s): Secondary | ICD-10-CM | POA: Diagnosis not present

## 2023-04-12 DIAGNOSIS — B353 Tinea pedis: Secondary | ICD-10-CM

## 2023-04-12 MED ORDER — CICLOPIROX 8 % EX SOLN
Freq: Every day | CUTANEOUS | 0 refills | Status: DC
  Filled 2023-04-12: qty 6.6, 30d supply, fill #0

## 2023-04-12 NOTE — Progress Notes (Signed)
Subjective:  Patient ID: David Martin, male    DOB: 1976-08-26,  MRN: [id number]  Chief Complaint  Patient presents with   Follow-up    Severe osteoarthritis of the right great toe. Patient finished the steroid pack and the meloxicam. Patient is doing a lot better. Denies any pain at this time.   Nail fungus. Patient completed course of Lamisil and the topical medication. He stated he is using an OTC spray for his nail fungus.     47 y.o. male presents for follow up on right osteoarthritis of the right great toe.  He took a steroid Dosepak and says that that week when he was taking it the pain went away and has not returned.  Denies any pain at this time.  Also following up for nail fungal infection and tinea pedis infection.  It was using ketoconazole cream and taking Lamisil.  Overall much improved.  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 Rob Bunting, MD GI 2015   Hyperlipidemia    Hypokalemia    IBS (irritable bowel syndrome)     No Known Allergies  ROS: Negative except as per HPI above  Objective:  General: AAO x3, NAD  Dermatological: Decreased red rash and interdigital maceration much improved from prior  Vascular:  Dorsalis Pedis artery and Posterior Tibial artery pedal pulses are 2/4 bilateral.  Capillary fill time < 3 sec to all digits.   Neruologic: Grossly intact via light touch bilateral. Protective threshold intact to all sites bilateral.   Musculoskeletal: Significant mallet toe deformity at the right hallux interphalangeal joint.  There is noted to be flexion contracture with very limited to no range of motion noted at the right hallux IPJ.  There is no pain on palpation of the dorsal and medial aspect of the joint.  Significant osseous prominence is noted. Overall improved.   Gait: Unassisted, Nonantalgic.   No images are attached to the encounter.  Radiographs:   Date: 03/01/2023 XR the right foot Weightbearing AP/Lateral/Oblique   Findings: Attention directed to the right hallux there is no to be severe arthritic changes of the interphalangeal joint with osseous fracture fragment present in the medial aspect of the joint.  There is irregularity at the joint space with loss of joint space and osteophyte formation as well as subchondral sclerosis.  Evidence of prior intra-articular fracture that has healed with osseous fragmentation present. Assessment:   1. Great toe pain, right   2. Tinea pedis of both feet   3. Onychomycosis   4. Pain and swelling of toe of right foot       Plan:  Patient was evaluated and treated and all questions answered.  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  Discussed topical and oral treatment.  Recommended topical treatment with 2% ketoconazole cream.  This was sent to the patient's pharmacy.  Also discussed appropriate foot hygiene, use of antifungal spray such as Tinactin in shoes, as well as cleaning her foot surfaces such as showers and bathroom floors  with bleach.   No follow-ups on file.          Corinna Gab, DPM [facility] / [facility]

## 2023-04-28 ENCOUNTER — Other Ambulatory Visit (HOSPITAL_COMMUNITY): Payer: Self-pay

## 2023-04-28 MED ORDER — AMILORIDE HCL 5 MG PO TABS
5.0000 mg | ORAL_TABLET | Freq: Every day | ORAL | 3 refills | Status: DC
  Filled 2023-04-28: qty 90, 90d supply, fill #0
  Filled 2023-07-28: qty 90, 90d supply, fill #1
  Filled 2023-11-02: qty 90, 90d supply, fill #2
  Filled 2024-01-31: qty 90, 90d supply, fill #3

## 2023-05-01 ENCOUNTER — Other Ambulatory Visit (HOSPITAL_COMMUNITY): Payer: Self-pay

## 2023-06-29 ENCOUNTER — Encounter: Payer: Commercial Managed Care - PPO | Admitting: Family Medicine

## 2023-07-28 ENCOUNTER — Other Ambulatory Visit (HOSPITAL_COMMUNITY): Payer: Self-pay

## 2023-07-31 ENCOUNTER — Other Ambulatory Visit (HOSPITAL_COMMUNITY): Payer: Self-pay

## 2023-08-04 ENCOUNTER — Other Ambulatory Visit (HOSPITAL_COMMUNITY): Payer: Self-pay

## 2023-08-04 ENCOUNTER — Ambulatory Visit: Payer: Commercial Managed Care - PPO | Admitting: Family Medicine

## 2023-08-04 ENCOUNTER — Encounter: Payer: Self-pay | Admitting: Family Medicine

## 2023-08-04 VITALS — BP 130/60 | HR 90 | Temp 97.8°F | Ht 70.0 in | Wt 169.4 lb

## 2023-08-04 DIAGNOSIS — Z13220 Encounter for screening for lipoid disorders: Secondary | ICD-10-CM | POA: Diagnosis not present

## 2023-08-04 DIAGNOSIS — E2681 Bartter's syndrome: Secondary | ICD-10-CM

## 2023-08-04 DIAGNOSIS — Z8659 Personal history of other mental and behavioral disorders: Secondary | ICD-10-CM

## 2023-08-04 DIAGNOSIS — Z0001 Encounter for general adult medical examination with abnormal findings: Secondary | ICD-10-CM

## 2023-08-04 DIAGNOSIS — Z713 Dietary counseling and surveillance: Secondary | ICD-10-CM | POA: Diagnosis not present

## 2023-08-04 LAB — LIPID PANEL
Cholesterol: 222 mg/dL — ABNORMAL HIGH (ref 0–200)
HDL: 63.2 mg/dL (ref >39.00)
LDL Cholesterol: 123 mg/dL — ABNORMAL HIGH (ref 0–99)
NonHDL: 158.31
Total CHOL/HDL Ratio: 4
Triglycerides: 179 mg/dL — ABNORMAL HIGH (ref 0.0–149.0)
VLDL: 35.8 mg/dL (ref 0.0–40.0)

## 2023-08-04 LAB — COMPREHENSIVE METABOLIC PANEL
ALT: 24 U/L (ref 0–53)
AST: 18 U/L (ref 0–37)
Albumin: 4.7 g/dL (ref 3.5–5.2)
Alkaline Phosphatase: 52 U/L (ref 39–117)
BUN: 16 mg/dL (ref 6–23)
CO2: 33 meq/L — ABNORMAL HIGH (ref 19–32)
Calcium: 9.7 mg/dL (ref 8.4–10.5)
Chloride: 99 meq/L (ref 96–112)
Creatinine, Ser: 1.2 mg/dL (ref 0.40–1.50)
GFR: 72.25 mL/min (ref >60.00)
Glucose, Bld: 90 mg/dL (ref 70–99)
Potassium: 3.4 meq/L — ABNORMAL LOW (ref 3.5–5.1)
Sodium: 140 meq/L (ref 135–145)
Total Bilirubin: 0.8 mg/dL (ref 0.2–1.2)
Total Protein: 7.6 g/dL (ref 6.0–8.3)

## 2023-08-04 LAB — CBC WITH DIFFERENTIAL/PLATELET
Basophils Absolute: 0.1 10*3/uL (ref 0.0–0.1)
Basophils Relative: 0.8 % (ref 0.0–3.0)
Eosinophils Absolute: 0.3 10*3/uL (ref 0.0–0.7)
Eosinophils Relative: 3.4 % (ref 0.0–5.0)
HCT: 42.8 % (ref 39.0–52.0)
Hemoglobin: 14.2 g/dL (ref 13.0–17.0)
Lymphocytes Relative: 22.8 % (ref 12.0–46.0)
Lymphs Abs: 1.8 10*3/uL (ref 0.7–4.0)
MCHC: 33.2 g/dL (ref 30.0–36.0)
MCV: 78.6 fL (ref 78.0–100.0)
Monocytes Absolute: 0.7 10*3/uL (ref 0.1–1.0)
Monocytes Relative: 8.9 % (ref 3.0–12.0)
Neutro Abs: 5.2 10*3/uL (ref 1.4–7.7)
Neutrophils Relative %: 64.1 % (ref 43.0–77.0)
Platelets: 262 10*3/uL (ref 150.0–400.0)
RBC: 5.44 Mil/uL (ref 4.22–5.81)
RDW: 14 % (ref 11.5–15.5)
WBC: 8 10*3/uL (ref 4.0–10.5)

## 2023-08-04 LAB — VITAMIN B12: Vitamin B-12: 436 pg/mL (ref 211–911)

## 2023-08-04 MED ORDER — ALPRAZOLAM 1 MG PO TABS
1.0000 mg | ORAL_TABLET | Freq: Every day | ORAL | 0 refills | Status: DC | PRN
  Filled 2023-08-04: qty 10, 10d supply, fill #0

## 2023-08-04 NOTE — Progress Notes (Signed)
Subjective  Chief Complaint  Patient presents with   Annual Exam    Pt here for Annual Exam and is currently fasting     HPI: David Martin is a 47 y.o. male who presents to [facility] [place] today for a Male Wellness Visit. He also has the concerns and/or needs as listed above in the chief complaint. These will be addressed in addition to the Health Maintenance Visit.   Wellness Visit: annual visit with health maintenance review and exam   HM: crc screen current. Healthy lifestyle. Work and home life are good. No concerns. Imms up to date. Has never had a formal eye exam.   Body mass index is 24.31 kg/m. Wt Readings from Last 3 Encounters:  08/04/23 169 lb 6.4 oz (76.8 kg)  09/30/22 175 lb (79.4 kg)  09/07/22 175 lb (79.4 kg)     Chronic disease management visit and/or acute problem visit: Sees renal annually at most now. Doing well.  Requests refill of xanax: has bottle with him. Did not use one last year but has flight planned in a few weeks and wants to have it just in case Discussed the use of AI scribe software for clinical note transcription with the patient, who gave verbal consent to proceed.  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 They report no new or different health concerns and are generally feeling well both physically and emotionally. They continue to manage their Bartter syndrome with daily potassium and magnesium supplements, which they have been taking for several years.  The patient has also been taking fish oil and an over-the-counter medication called Colestid to manage their cholesterol levels, and they have noticed a decrease in their cholesterol numbers since starting this regimen. They have not eaten meat, except for [place] and salmon, for some time and have been practicing a 12-15 hour fasting regimen during the week. They  express concern about their protein intake, as they are primarily getting protein from eggs.  The patient also mentions a recent change in their diet, which now excludes chicken and red meat. They are primarily consuming [place], salmon, and eggs for protein. They express concern about their protein intake and whether they are consuming too many eggs.      Assessment  1. Encounter for well adult exam with abnormal findings   2. Bartters syndrome (HCC)   3. History of panic attacks      Plan  Male Wellness Visit: Age appropriate Health Maintenance and Prevention measures were discussed with patient. Included topics are cancer screening recommendations, ways to keep healthy (see AVS) including dietary and exercise recommendations, regular eye and dental care, use of seat belts, and avoidance of moderate alcohol use and tobacco use.  BMI: discussed patient's BMI and encouraged positive lifestyle modifications to help get to or maintain a target BMI. HM needs and immunizations were addressed and ordered. See below for orders. See HM and immunization section for updates. Routine labs and screening tests ordered including cmp, cbc and lipids where appropriate. Discussed recommendations regarding Vit D and calcium supplementation (see AVS)  Chronic disease f/u and/or acute problem visit: (deemed necessary to be done in addition to the wellness visit): Assessment and Plan    Dietary Protein Intake Concern about protein intake due to dietary changes, including elimination of most meats except [place] and salmon. Discussed alternative protein sources and the importance  of vitamin B12. - Check vitamin B12 level - Encourage consumption of alternative protein sources such as quinoa, chia seeds, peanut butter, hummus, and dairy products  Bartter Syndrome Chronic condition managed with potassium and magnesium supplements. Reports well-managed condition with annual nephrologist follow-ups. - Continue  potassium and magnesium supplements - Follow up with nephrologist annually  General Health Maintenance Annual physical examination. Immunizations and colonoscopy up to date. Discussed importance of regular eye exams, especially given age and lack of previous exams. - Perform physical examination - Order routine blood work - Schedule an eye exam within the next year  Follow-up: 12 mo for cpe - Refill prescription for xanax for if needed use - Send prescription to pharmacy for pick up.   Commons side effects, risks, benefits, and alternatives for medications and treatment plan prescribed today were discussed, and the patient expressed understanding of the given instructions. Patient is instructed to call or message via MyChart if he/she has any questions or concerns regarding our treatment plan. No barriers to understanding were identified. We discussed Red Flag symptoms and signs in detail. Patient expressed understanding regarding what to do in case of urgent or emergency type symptoms.  Medication list was reconciled, printed and provided to the patient in AVS. Patient instructions and summary information was reviewed with the patient as documented in the AVS. This note was prepared with assistance of Dragon voice recognition software. Occasional wrong-word or sound-a-like substitutions may have occurred due to the inherent limitations of voice recognition software  Orders Placed This Encounter  Procedures   Lipid panel   Comprehensive metabolic panel   CBC with Differential/Platelet   Vitamin B12   Meds ordered this encounter  Medications   ALPRAZolam (XANAX) 1 MG tablet    Sig: Take 1 tablet by mouth daily as needed for anxiety.    Dispense:  10 tablet    Refill:  0     Patient Active Problem List   Diagnosis Date Noted   Subareolar gynecomastia in male 04/01/2021   Low testosterone in male 04/01/2021   Cholelithiases 12/21/2017   History of panic attacks 12/21/2017    Conductive hearing loss 10/04/2017   Bartters syndrome (HCC) 01/06/2016   IBS (irritable bowel syndrome) 04/23/2008   Health Maintenance  Topic Date Due   COVID-19 Vaccine (3 - 2023-24 season) 08/20/2023 (Originally 05/02/2023)   DTaP/Tdap/Td (2 - Td or Tdap) 10/29/2025   Colonoscopy  09/30/2029   INFLUENZA VACCINE  Completed   Hepatitis C Screening  Completed   HIV Screening  Completed   HPV VACCINES  Aged Out   Immunization History  Administered Date(s) Administered   Influenza Inj Mdck Quad Pf 06/28/2019   Influenza Split 06/03/2012   Influenza Whole 06/17/2010   Influenza,inj,Quad PF,6+ Mos 05/29/2016, 06/15/2022, 06/15/2023   Influenza-Unspecified 06/22/2021   PFIZER(Purple Top)SARS-COV-2 Vaccination 10/01/2020, 11/29/2020   Tdap 10/30/2015   We updated and reviewed the patient's past history in detail and it is documented below. Allergies: Patient has No Known Allergies. Past Medical History  has a past medical history of Anxiety, Bartter syndrome (HCC), Cholelithiases (12/21/2017), Hyperlipidemia, Hypokalemia, and IBS (irritable bowel syndrome). Past Surgical History Patient  has a past surgical history that includes No past surgeries. Social History Patient  reports that he has never smoked. He has never used smokeless tobacco. He reports that he does not drink alcohol and does not use drugs. Family History family history includes Colon polyps in his father; Dementia in his maternal aunt, maternal uncle, and mother;  Diabetes in his paternal grandfather and paternal grandmother; Hyperlipidemia in his paternal grandfather and paternal grandmother; Hypertension in his father. Review of Systems: Constitutional: negative for fever or malaise Ophthalmic: negative for photophobia, double vision or loss of vision Cardiovascular: negative for chest pain, dyspnea on exertion, or new LE swelling Respiratory: negative for SOB or persistent cough Gastrointestinal: negative for  abdominal pain, change in bowel habits or melena Genitourinary: negative for dysuria or gross hematuria Musculoskeletal: negative for new gait disturbance or muscular weakness Integumentary: negative for new or persistent rashes Neurological: negative for TIA or stroke symptoms Psychiatric: negative for SI or delusions Allergic/Immunologic: negative for hives  Patient Care Team    Relationship Specialty Notifications Start End  Willow Ora, MD PCP - General Family Medicine  12/21/17    Objective  Vitals: BP 130/60   Pulse 90   Temp 97.8 F (36.6 C)   Ht 5\' 10"  (1.778 m)   Wt 169 lb 6.4 oz (76.8 kg)   SpO2 98%   BMI 24.31 kg/m  General:  Well developed, well nourished, no acute distress  Psych:  Alert and orientedx3,normal mood and affect HEENT:  Normocephalic, atraumatic, non-icteric sclera,  oropharynx is clear without mass or exudate, supple neck without adenopathy, or thyromegaly Cardiovascular:  Normal S1, S2, RRR without gallop, rub or murmur,  Respiratory:  Good breath sounds bilaterally, CTAB with normal respiratory effort Gastrointestinal: normal bowel sounds, soft, non-tender, no noted masses. No HSM MSK: Joints are without erythema or swelling.  Skin:  Warm, no rashes Neurologic:    Mental status is normal.  Gross motor and sensory exams are normal. Stable gait. No tremor

## 2023-08-04 NOTE — Patient Instructions (Addendum)
Please return in 12 months for your annual complete physical; please come fasting.   I will release your lab results to you on your MyChart account with further instructions. You may see the results before I do, but when I review them I will send you a message with my report or have my assistant call you if things need to be discussed. Please reply to my message with any questions. Thank you!   If you have any questions or concerns, please don't hesitate to send me a message via MyChart or call the office at [phone]. Thank you for visiting with David Martin today! It's our pleasure caring for you.   VISIT SUMMARY:  Today, you had your annual physical examination. You reported feeling well overall and discussed your current health management, including your Bartter syndrome, cholesterol levels, and dietary changes. We also addressed your concerns about protein intake and refilled your Zofran prescription for your upcoming trip.  YOUR PLAN:  -DIETARY PROTEIN INTAKE: You are concerned about your protein intake due to recent dietary changes. We discussed alternative protein sources such as quinoa, chia seeds, peanut butter, hummus, and dairy products. We will also check your vitamin B12 level to ensure you are getting enough of this important nutrient.  -HYPERLIPIDEMIA: Hyperlipidemia means having high cholesterol levels. You are managing your cholesterol with Colestid and have noticed improvements. Continue taking Colestid as prescribed.  -BARTTER SYNDROME: Bartter syndrome is a rare kidney disorder that affects the body's ability to reabsorb certain electrolytes. You are managing this condition well with potassium and magnesium supplements and regular follow-ups with your nephrologist. Continue your current supplement regimen and follow up with your nephrologist annually.  -GENERAL HEALTH MAINTENANCE: Your annual physical examination was performed, and we discussed the importance of regular eye exams,  especially since you have not had one recently. Your immunizations and colonoscopy are up to date. Routine blood work will be ordered to monitor your overall health. Please schedule an eye exam within the next year.  INSTRUCTIONS:  Your xanax prescription has been refilled and sent to the pharmacy for pick up. Please follow up with your nephrologist annually for your Bartter syndrome management. Schedule an eye exam within the next year. We will check your vitamin B12 level to ensure adequate intake.

## 2023-08-05 ENCOUNTER — Other Ambulatory Visit (HOSPITAL_COMMUNITY): Payer: Self-pay

## 2023-08-05 NOTE — Progress Notes (Signed)
See mychart note The 10-year ASCVD risk score (Arnett DK, et al., 2019) is: 7.5%   Values used to calculate the score:     Age: 47 years     Sex: Male     Is Non-Hispanic African American: Yes     Diabetic: No     Tobacco smoker: No     Systolic Blood Pressure: 130 mmHg     Is BP treated: Yes     HDL Cholesterol: 63.2 mg/dL     Total Cholesterol: 222 mg/dL  Dear Mr. Wootton, It was good seeing you, I am glad you are doing so well.  Your lab results all look good except your potassium is just below normal.  Also, your cholesterol level remains stable but your overall cardiovascular risk is increasing.  Continue your supplements and a low-fat diet.  I will check again next year and if remains in this range, will recommend cholesterol-lowering medication.  Let me know if you have any questions Sincerely, Dr. Mardelle Matte

## 2023-10-10 ENCOUNTER — Encounter: Payer: Self-pay | Admitting: Family Medicine

## 2023-11-22 ENCOUNTER — Other Ambulatory Visit (HOSPITAL_COMMUNITY): Payer: Self-pay

## 2023-11-23 ENCOUNTER — Other Ambulatory Visit (HOSPITAL_COMMUNITY): Payer: Self-pay

## 2023-11-23 MED ORDER — POTASSIUM CHLORIDE CRYS ER 20 MEQ PO TBCR
60.0000 meq | EXTENDED_RELEASE_TABLET | Freq: Three times a day (TID) | ORAL | 3 refills | Status: AC
  Filled 2023-11-23: qty 810, 90d supply, fill #0
  Filled 2024-02-20: qty 810, 90d supply, fill #1
  Filled 2024-06-08: qty 810, 90d supply, fill #2
  Filled 2024-09-21: qty 810, 90d supply, fill #3

## 2024-01-31 ENCOUNTER — Other Ambulatory Visit (HOSPITAL_COMMUNITY): Payer: Self-pay

## 2024-02-01 ENCOUNTER — Other Ambulatory Visit (HOSPITAL_COMMUNITY): Payer: Self-pay

## 2024-02-01 DIAGNOSIS — L309 Dermatitis, unspecified: Secondary | ICD-10-CM | POA: Diagnosis not present

## 2024-02-01 MED ORDER — TRIAMCINOLONE ACETONIDE 0.025 % EX OINT
TOPICAL_OINTMENT | Freq: Every day | CUTANEOUS | 0 refills | Status: DC
  Filled 2024-02-01: qty 15, 15d supply, fill #0

## 2024-02-21 ENCOUNTER — Other Ambulatory Visit (HOSPITAL_COMMUNITY): Payer: Self-pay

## 2024-02-21 ENCOUNTER — Other Ambulatory Visit: Payer: Self-pay

## 2024-02-21 DIAGNOSIS — E876 Hypokalemia: Secondary | ICD-10-CM | POA: Diagnosis not present

## 2024-04-26 ENCOUNTER — Other Ambulatory Visit (HOSPITAL_COMMUNITY): Payer: Self-pay

## 2024-04-26 DIAGNOSIS — L249 Irritant contact dermatitis, unspecified cause: Secondary | ICD-10-CM | POA: Diagnosis not present

## 2024-04-26 MED ORDER — PREDNISONE 20 MG PO TABS
40.0000 mg | ORAL_TABLET | ORAL | 0 refills | Status: DC
  Filled 2024-04-26: qty 15, 5d supply, fill #0

## 2024-04-26 MED ORDER — FLUOCINONIDE 0.05 % EX OINT
TOPICAL_OINTMENT | Freq: Two times a day (BID) | CUTANEOUS | 1 refills | Status: DC
  Filled 2024-04-26: qty 60, 30d supply, fill #0

## 2024-04-27 ENCOUNTER — Other Ambulatory Visit (HOSPITAL_COMMUNITY): Payer: Self-pay

## 2024-04-28 ENCOUNTER — Other Ambulatory Visit (HOSPITAL_COMMUNITY): Payer: Self-pay

## 2024-05-02 ENCOUNTER — Other Ambulatory Visit (HOSPITAL_COMMUNITY): Payer: Self-pay

## 2024-05-02 MED ORDER — AMILORIDE HCL 5 MG PO TABS
5.0000 mg | ORAL_TABLET | Freq: Every day | ORAL | 3 refills | Status: AC
  Filled 2024-05-02: qty 90, 90d supply, fill #0
  Filled 2024-08-04: qty 90, 90d supply, fill #1

## 2024-06-13 ENCOUNTER — Other Ambulatory Visit (HOSPITAL_COMMUNITY): Payer: Self-pay

## 2024-08-08 ENCOUNTER — Encounter: Admitting: Family Medicine

## 2024-08-09 ENCOUNTER — Encounter: Payer: Self-pay | Admitting: Family Medicine

## 2024-08-09 ENCOUNTER — Other Ambulatory Visit (HOSPITAL_COMMUNITY): Payer: Self-pay

## 2024-08-09 ENCOUNTER — Ambulatory Visit: Admitting: Family Medicine

## 2024-08-09 VITALS — BP 132/84 | HR 55 | Temp 97.7°F | Ht 70.0 in | Wt 176.8 lb

## 2024-08-09 DIAGNOSIS — Z0001 Encounter for general adult medical examination with abnormal findings: Secondary | ICD-10-CM

## 2024-08-09 DIAGNOSIS — E2681 Bartter's syndrome: Secondary | ICD-10-CM

## 2024-08-09 DIAGNOSIS — Z13220 Encounter for screening for lipoid disorders: Secondary | ICD-10-CM | POA: Diagnosis not present

## 2024-08-09 DIAGNOSIS — D126 Benign neoplasm of colon, unspecified: Secondary | ICD-10-CM | POA: Diagnosis not present

## 2024-08-09 DIAGNOSIS — L301 Dyshidrosis [pompholyx]: Secondary | ICD-10-CM | POA: Insufficient documentation

## 2024-08-09 DIAGNOSIS — Z8659 Personal history of other mental and behavioral disorders: Secondary | ICD-10-CM | POA: Diagnosis not present

## 2024-08-09 LAB — CBC WITH DIFFERENTIAL/PLATELET
Basophils Absolute: 0.1 K/uL (ref 0.0–0.1)
Basophils Relative: 1.3 % (ref 0.0–3.0)
Eosinophils Absolute: 0.1 K/uL (ref 0.0–0.7)
Eosinophils Relative: 1.3 % (ref 0.0–5.0)
HCT: 39.6 % (ref 39.0–52.0)
Hemoglobin: 13.2 g/dL (ref 13.0–17.0)
Lymphocytes Relative: 31.3 % (ref 12.0–46.0)
Lymphs Abs: 2.4 K/uL (ref 0.7–4.0)
MCHC: 33.4 g/dL (ref 30.0–36.0)
MCV: 77.3 fl — ABNORMAL LOW (ref 78.0–100.0)
Monocytes Absolute: 0.8 K/uL (ref 0.1–1.0)
Monocytes Relative: 10 % (ref 3.0–12.0)
Neutro Abs: 4.4 K/uL (ref 1.4–7.7)
Neutrophils Relative %: 56.1 % (ref 43.0–77.0)
Platelets: 256 K/uL (ref 150.0–400.0)
RBC: 5.12 Mil/uL (ref 4.22–5.81)
RDW: 13.7 % (ref 11.5–15.5)
WBC: 7.8 K/uL (ref 4.0–10.5)

## 2024-08-09 LAB — COMPREHENSIVE METABOLIC PANEL WITH GFR
ALT: 24 U/L (ref 0–53)
AST: 16 U/L (ref 0–37)
Albumin: 4.9 g/dL (ref 3.5–5.2)
Alkaline Phosphatase: 49 U/L (ref 39–117)
BUN: 16 mg/dL (ref 6–23)
CO2: 29 meq/L (ref 19–32)
Calcium: 9.5 mg/dL (ref 8.4–10.5)
Chloride: 102 meq/L (ref 96–112)
Creatinine, Ser: 1.16 mg/dL (ref 0.40–1.50)
GFR: 74.72 mL/min (ref >60.00)
Glucose, Bld: 97 mg/dL (ref 70–99)
Potassium: 3.9 meq/L (ref 3.5–5.1)
Sodium: 140 meq/L (ref 135–145)
Total Bilirubin: 0.7 mg/dL (ref 0.2–1.2)
Total Protein: 7.2 g/dL (ref 6.0–8.3)

## 2024-08-09 LAB — TSH: TSH: 4.69 u[IU]/mL (ref 0.35–5.50)

## 2024-08-09 LAB — LIPID PANEL
Cholesterol: 233 mg/dL — ABNORMAL HIGH (ref 0–200)
HDL: 66.9 mg/dL (ref >39.00)
LDL Cholesterol: 135 mg/dL — ABNORMAL HIGH (ref 0–99)
NonHDL: 166.47
Total CHOL/HDL Ratio: 3
Triglycerides: 159 mg/dL — ABNORMAL HIGH (ref 0.0–149.0)
VLDL: 31.8 mg/dL (ref 0.0–40.0)

## 2024-08-09 LAB — MAGNESIUM: Magnesium: 1.3 mg/dL — ABNORMAL LOW (ref 1.5–2.5)

## 2024-08-09 MED ORDER — ALPRAZOLAM 1 MG PO TABS
1.0000 mg | ORAL_TABLET | Freq: Every day | ORAL | 0 refills | Status: AC | PRN
  Filled 2024-08-09 – 2024-08-14 (×2): qty 10, 10d supply, fill #0

## 2024-08-09 NOTE — Progress Notes (Signed)
 Subjective  Chief Complaint  Patient presents with   Annual Exam    Pt here for Annual Exam and is currently fasting     HPI: David Martin is a 48 y.o. male who presents to [facility] [place] today for a Male Wellness Visit. He also has the concerns and/or needs as listed above in the chief complaint. These will be addressed in addition to the Health Maintenance Visit.   Wellness Visit: annual visit with health maintenance review and exam   HM: Screens are current. Reviewed colonoscopy 08/2022: tubular adenoma, repeat in 7 years. Healthy lifestyle. Happy home life. Works night shift. Exercises.   Body mass index is 25.37 kg/m. Wt Readings from Last 3 Encounters:  08/09/24 176 lb 12.8 oz (80.2 kg)  08/04/23 169 lb 6.4 oz (76.8 kg)  09/30/22 175 lb (79.4 kg)   Chronic disease management visit and/or acute problem visit: Discussed the use of AI scribe software for clinical note transcription with the patient, who gave verbal consent to proceed.  History of Present Illness David Martin is a 48 year old male who presents with persistent hand dermatitis.  Hand dermatitis - Persistent dermatitis primarily affecting one finger, ongoing since before the summer - Skin is dried out and becomes hard between the fingers - Swelling, pruritus, and occasional skin splitting in the affected area - Symptoms temporarily improve with topical treatments but recur after hand washing - Resistant to prednisone , prescribed cream, and various over-the-counter medications  Environmental and occupational exposures - Frequent hand washing required at work - [employer], described as very drying, may be contributing to symptoms - Attempts to mitigate symptoms by using personal soap, with limited improvement  Bartters syndrome on potassium supplements. Reports stable  Requests xanax  refill. H/o panic and likes to have it on hand. Hasn't needed any in the last  year.     Assessment  1. Encounter for well adult exam with abnormal findings   2. Bartters syndrome   3. History of panic attacks   4. Dyshidrotic eczema   5. Tubular adenoma of colon      Plan  Male Wellness Visit: Age appropriate Health Maintenance and Prevention measures were discussed with patient. Included topics are cancer screening recommendations, ways to keep healthy (see AVS) including dietary and exercise recommendations, regular eye and dental care, use of seat belts, and avoidance of moderate alcohol use and tobacco use.  BMI: discussed patient's BMI and encouraged positive lifestyle modifications to help get to or maintain a target BMI. HM needs and immunizations were addressed and ordered. See below for orders. See HM and immunization section for updates. Routine labs and screening tests ordered including cmp, cbc and lipids where appropriate. Discussed recommendations regarding Vit D and calcium supplementation (see AVS)  Chronic disease f/u and/or acute problem visit: (deemed necessary to be done in addition to the wellness visit): Assessment and Plan Assessment & Plan Bartter's syndrome Well-managed with no recent nephrology reports, but previous evaluations were normal. - Ensure follow-up with nephrology next year - check potassium and magnesium   Chronic hand dermatitis; likely contact dermatitis or dishydrotic eczema Likely exacerbated by frequent hand washing and use of drying soap. Previous treatment with prednisone  and steroid cream was ineffective. Symptoms include swelling, itching, and skin splitting, primarily affecting one finger. - Continue using steroid cream to manage symptoms - Avoid using the drying soap at work - Follow up with dermatologist in January  Panic disorder, in  remission Panic disorder is in remission with no recent panic attacks. He uses medication as needed for anxiety, especially during stressful situations such as vacations. -  Refilled medication for panic disorder for vacation use  General Health Maintenance Routine health maintenance is up to date. Recent colonoscopy showed two blue adenomas, with a follow-up recommended in seven years. Lung health is stable. - Continue routine health maintenance schedule    Follow up: 12 mo for cpe Commons side effects, risks, benefits, and alternatives for medications and treatment plan prescribed today were discussed, and the patient expressed understanding of the given instructions. Patient is instructed to call or message via MyChart if he/she has any questions or concerns regarding our treatment plan. No barriers to understanding were identified. We discussed Red Flag symptoms and signs in detail. Patient expressed understanding regarding what to do in case of urgent or emergency type symptoms.  Medication list was reconciled, printed and provided to the patient in AVS. Patient instructions and summary information was reviewed with the patient as documented in the AVS. This note was prepared with assistance of Dragon voice recognition software. Occasional wrong-word or sound-a-like substitutions may have occurred due to the inherent limitations of voice recognition software  Orders Placed This Encounter  Procedures   CBC with Differential/Platelet   Comprehensive metabolic panel with GFR   Lipid panel   TSH   Magnesium    Meds ordered this encounter  Medications   ALPRAZolam  (XANAX ) 1 MG tablet    Sig: Take 1 tablet by mouth daily as needed for anxiety.    Dispense:  10 tablet    Refill:  0     Patient Active Problem List   Diagnosis Date Noted   Tubular adenoma of colon 08/09/2024   Bartters syndrome 01/06/2016   Low testosterone  in male 04/01/2021   Cholelithiases 12/21/2017   History of panic attacks 12/21/2017   Conductive hearing loss 10/04/2017   IBS (irritable bowel syndrome) 04/23/2008   Dyshidrotic eczema 08/09/2024   Subareolar gynecomastia in male  04/01/2021   Health Maintenance  Topic Date Due   Hepatitis B Vaccines 19-59 Average Risk (1 of 3 - 19+ 3-dose series) Never done   COVID-19 Vaccine (3 - 2025-26 season) 08/25/2024 (Originally 05/01/2024)   DTaP/Tdap/Td (2 - Td or Tdap) 10/29/2025   Colonoscopy  09/30/2029   Influenza Vaccine  Completed   Hepatitis C Screening  Completed   HIV Screening  Completed   Pneumococcal Vaccine  Aged Out   HPV VACCINES  Aged Out   Meningococcal B Vaccine  Aged Out   Immunization History  Administered Date(s) Administered   Influenza Inj Mdck Quad Pf 06/28/2019   Influenza Split 06/03/2012   Influenza Whole 06/17/2010   Influenza,inj,Quad PF,6+ Mos 05/29/2016, 06/15/2022, 06/15/2023, 06/14/2024   Influenza-Unspecified 05/29/2016, 06/22/2021   PFIZER(Purple Top)SARS-COV-2 Vaccination 10/01/2020, 11/29/2020   Tdap 10/30/2015   We updated and reviewed the patient's past history in detail and it is documented below. Allergies: Patient has no known allergies. Past Medical History  has a past medical history of Anxiety, Bartter syndrome, Cholelithiases (12/21/2017), Hyperlipidemia, Hypokalemia, and IBS (irritable bowel syndrome). Past Surgical History Patient  has a past surgical history that includes No past surgeries. Social History Patient  reports that he has never smoked. He has never used smokeless tobacco. He reports that he does not drink alcohol and does not use drugs. Family History family history includes Colon polyps in his father; Dementia in his maternal aunt, maternal uncle, and mother; Diabetes  in his paternal grandfather and paternal grandmother; Hyperlipidemia in his paternal grandfather and paternal grandmother; Hypertension in his father. Review of Systems: Constitutional: negative for fever or malaise Ophthalmic: negative for photophobia, double vision or loss of vision Cardiovascular: negative for chest pain, dyspnea on exertion, or new LE swelling Respiratory: negative  for SOB or persistent cough Gastrointestinal: negative for abdominal pain, change in bowel habits or melena Genitourinary: negative for dysuria or gross hematuria Musculoskeletal: negative for new gait disturbance or muscular weakness Integumentary: negative for new or persistent rashes Neurological: negative for TIA or stroke symptoms Psychiatric: negative for SI or delusions Allergic/Immunologic: negative for hives  Patient Care Team    Relationship Specialty Notifications Start End  Jodie Lavern CROME, MD PCP - General Family Medicine  12/21/17    Objective  Vitals: BP 132/84   Pulse (!) 55   Temp 97.7 F (36.5 C)   Ht 5' 10 (1.778 m)   Wt 176 lb 12.8 oz (80.2 kg)   SpO2 97%   BMI 25.37 kg/m  General:  Well developed, well nourished, no acute distress  Psych:  Alert and orientedx3,normal mood and affect HEENT:  Normocephalic, atraumatic, non-icteric sclera,  oropharynx is clear without mass or exudate, supple neck without adenopathy, or thyromegaly Cardiovascular:  Normal S1, S2, RRR without gallop, rub or murmur,  Respiratory:  Good breath sounds bilaterally, CTAB with normal respiratory effort Gastrointestinal: normal bowel sounds, soft, non-tender, no noted masses. No HSM MSK: Joints are without erythema or swelling.  Skin:  Warm, dry cracking and flaking and swollen skin on volar hands/fingers Neurologic:    Mental status is normal.  Gross motor and sensory exams are normal. Stable gait. No tremor

## 2024-08-09 NOTE — Patient Instructions (Signed)
Please return in 12 months for your annual complete physical; please come fasting.  ? ?I will release your lab results to you on your MyChart account with further instructions. You may see the results before I do, but when I review them I will send you a message with my report or have my assistant call you if things need to be discussed. Please reply to my message with any questions. Thank you!  ? ?If you have any questions or concerns, please don't hesitate to send me a message via MyChart or call the office at [phone]. Thank you for visiting with us today! It's our pleasure caring for you.  ? ?Please do these things to maintain good health! ? ?Exercise at least 30-45 minutes a day,  4-5 days a week.  ?Eat a low-fat diet with lots of fruits and vegetables, up to 7-9 servings per day. ?Drink plenty of water daily. Try to drink 8 8oz glasses per day. ?Seatbelts can save your life. Always wear your seatbelt. ?Place Smoke Detectors on every level of your home and check batteries every year. ?Eye Doctor - have an eye exam every 1-2 years ?Safe sex - use condoms to protect yourself from STDs if you could be exposed to these types of infections. ?Avoid heavy alcohol use. If you drink, keep it to less than 2 drinks/day and not every day. ?Health Care Power of Attorney.  Choose someone you trust that could speak for you if you became unable to speak for yourself. ?Depression is common in our stressful world.If you're feeling down or losing interest in things you normally enjoy, please come in for a visit.  ?

## 2024-08-14 ENCOUNTER — Ambulatory Visit: Payer: Self-pay | Admitting: Family Medicine

## 2024-08-14 ENCOUNTER — Other Ambulatory Visit (HOSPITAL_COMMUNITY): Payer: Self-pay

## 2024-08-14 NOTE — Progress Notes (Signed)
 See mychart note

## 2024-08-16 NOTE — Telephone Encounter (Signed)
 Left Voicemail for patient to return call or send a MyChart message if he still had questions regarding Xanax .     Copied from CRM #[id number]. Topic: Clinical - Prescription Issue >> Aug 11, 2024  3:27 PM Ivette P wrote: Reason for CRM: Pt called in about ALPRAZolam  (XANAX ) 1 MG tablet and said he spoke to nurse and would like for nurse to give him a call regarding the medication

## 2024-09-12 ENCOUNTER — Other Ambulatory Visit (HOSPITAL_COMMUNITY): Payer: Self-pay

## 2024-09-12 MED ORDER — CLOBETASOL PROPIONATE 0.05 % EX OINT
TOPICAL_OINTMENT | Freq: Two times a day (BID) | CUTANEOUS | 2 refills | Status: AC
  Filled 2024-09-12: qty 60, 30d supply, fill #0

## 2024-09-12 MED ORDER — DESONIDE 0.05 % EX OINT
TOPICAL_OINTMENT | Freq: Two times a day (BID) | CUTANEOUS | 1 refills | Status: AC
  Filled 2024-09-12: qty 60, 30d supply, fill #0

## 2024-09-13 ENCOUNTER — Other Ambulatory Visit (HOSPITAL_COMMUNITY): Payer: Self-pay

## 2024-09-19 ENCOUNTER — Encounter: Payer: Self-pay | Admitting: Family Medicine

## 2024-09-22 ENCOUNTER — Other Ambulatory Visit (HOSPITAL_COMMUNITY): Payer: Self-pay
